# Patient Record
Sex: Male | Born: 1975 | Race: Black or African American | Hispanic: No | Smoking: Current every day smoker
Health system: Southern US, Community
[De-identification: ages and names within clinical notes are randomized; demographics above are authoritative.]

---

## 2004-03-23 ENCOUNTER — Emergency Department (HOSPITAL_COMMUNITY): Admission: EM | Admit: 2004-03-23 | Discharge: 2004-03-24 | Payer: Self-pay | Admitting: Emergency Medicine

## 2004-03-23 ENCOUNTER — Emergency Department (HOSPITAL_COMMUNITY): Admission: EM | Admit: 2004-03-23 | Discharge: 2004-03-23 | Payer: Self-pay | Admitting: Emergency Medicine

## 2005-04-26 ENCOUNTER — Emergency Department (HOSPITAL_COMMUNITY): Admission: EM | Admit: 2005-04-26 | Discharge: 2005-04-27 | Payer: Self-pay | Admitting: Emergency Medicine

## 2012-08-24 ENCOUNTER — Encounter (HOSPITAL_COMMUNITY): Payer: Self-pay | Admitting: Emergency Medicine

## 2012-08-24 ENCOUNTER — Emergency Department (HOSPITAL_COMMUNITY)
Admission: EM | Admit: 2012-08-24 | Discharge: 2012-08-24 | Disposition: A | Discharge status: Home / Self Care | Payer: Self-pay | Attending: Emergency Medicine | Admitting: Emergency Medicine

## 2012-08-24 DIAGNOSIS — F17200 Nicotine dependence, unspecified, uncomplicated: Secondary | ICD-10-CM | POA: Insufficient documentation

## 2012-08-24 DIAGNOSIS — N342 Other urethritis: Secondary | ICD-10-CM | POA: Insufficient documentation

## 2012-08-24 MED ORDER — AZITHROMYCIN 250 MG PO TABS
1000.0000 mg | ORAL_TABLET | Freq: Once | ORAL | Status: AC
  Administered 2012-08-24: 1000 mg via ORAL
  Filled 2012-08-24: qty 4

## 2012-08-24 MED ORDER — CEFTRIAXONE SODIUM 250 MG IJ SOLR
250.0000 mg | Freq: Once | INTRAMUSCULAR | Status: AC
  Administered 2012-08-24: 250 mg via INTRAMUSCULAR
  Filled 2012-08-24: qty 250

## 2012-08-24 NOTE — ED Provider Notes (Signed)
Medical screening examination/treatment/procedure(s) were performed by non-physician practitioner and as supervising physician I was immediately available for consultation/collaboration.  Arbadella Kimbler M Kasi Lasky, MD 08/24/12 0852 

## 2012-08-24 NOTE — ED Notes (Signed)
Patient states he has had white penile discharge x 4 days.  Patient states it burns when he urinates intermittently.

## 2012-08-24 NOTE — ED Provider Notes (Signed)
History     CSN: [id number]  Arrival date & time 08/24/12  0705   First MD Initiated Contact with Patient 08/24/12 [phone]      No chief complaint on file.   (Consider location/radiation/quality/duration/timing/severity/associated sxs/prior treatment) Patient is a 37 y.o. male presenting with penile discharge. The history is provided by the patient. No language interpreter was used.  Penile Discharge This is a new problem. The current episode started today. The problem occurs constantly. Pertinent negatives include no abdominal pain. Nothing aggravates the symptoms. He has tried nothing for the symptoms. The treatment provided moderate relief.  Pt reports burning with urination and penile discharge  No past medical history on file.  No past surgical history on file.  No family history on file.  History  Substance Use Topics  . Smoking status: Not on file  . Smokeless tobacco: Not on file  . Alcohol Use: Not on file      Review of Systems  Gastrointestinal: Negative for abdominal pain.  Genitourinary: Positive for discharge.  All other systems reviewed and are negative.    Allergies  Review of patient's allergies indicates not on file.  Home Medications  No current outpatient prescriptions on file.  BP 140/91  Pulse 89  Temp(Src) 98.9 F (37.2 C) (Oral)  SpO2 99%  Physical Exam  Constitutional: He is oriented to person, place, and time. He appears well-developed.  HENT:  Head: Normocephalic.  Eyes: Pupils are equal, round, and reactive to light.  Neck: Normal range of motion.  Cardiovascular: Normal rate.   Pulmonary/Chest: Effort normal.  Genitourinary: Penis normal.  Clear discharge no lesions  Neurological: He is alert and oriented to person, place, and time. He has normal reflexes.  Skin: Skin is warm.  Psychiatric: He has a normal mood and affect.    ED Course  Procedures (including critical care time)  Labs Reviewed - No data to display No  results found.   No diagnosis found.    MDM  Rocephin and zithromax        Elson Areas, [place] 08/24/12 [phone]

## 2012-08-27 ENCOUNTER — Telehealth (HOSPITAL_COMMUNITY): Payer: Self-pay | Admitting: Emergency Medicine

## 2013-06-04 ENCOUNTER — Emergency Department (HOSPITAL_COMMUNITY)
Admission: EM | Admit: 2013-06-04 | Discharge: 2013-06-04 | Disposition: A | Discharge status: Home / Self Care | Payer: Self-pay | Source: Home / Self Care

## 2013-06-05 ENCOUNTER — Encounter (HOSPITAL_COMMUNITY): Payer: Self-pay | Admitting: Emergency Medicine

## 2013-06-05 ENCOUNTER — Emergency Department (INDEPENDENT_AMBULATORY_CARE_PROVIDER_SITE_OTHER)
Admission: EM | Admit: 2013-06-05 | Discharge: 2013-06-05 | Disposition: A | Discharge status: Home / Self Care | Payer: Self-pay | Source: Home / Self Care | Attending: Family Medicine | Admitting: Family Medicine

## 2013-06-05 DIAGNOSIS — A084 Viral intestinal infection, unspecified: Secondary | ICD-10-CM

## 2013-06-05 DIAGNOSIS — A088 Other specified intestinal infections: Secondary | ICD-10-CM

## 2013-06-05 DIAGNOSIS — K602 Anal fissure, unspecified: Secondary | ICD-10-CM

## 2013-06-05 LAB — OCCULT BLOOD, POC DEVICE: FECAL OCCULT BLD: NEGATIVE

## 2013-06-05 MED ORDER — LOPERAMIDE HCL 2 MG PO CAPS: 2.0000 mg | ORAL_CAPSULE | Freq: Four times a day (QID) | ORAL | Status: DC | PRN

## 2013-06-05 MED ORDER — ONDANSETRON 8 MG PO TBDP: 8.0000 mg | ORAL_TABLET | Freq: Three times a day (TID) | ORAL | Status: DC | PRN

## 2013-06-05 NOTE — Discharge Instructions (Signed)
Anal Fissure, Adult An anal fissure is a small tear or crack in the skin around the anus. Bleeding from a fissure usually stops on its own within a few minutes. However, bleeding will often reoccur with each bowel movement until the crack heals.  CAUSES   Passing large, hard stools.  Frequent diarrheal stools.  Constipation.  Inflammatory bowel disease (Crohn's disease or ulcerative colitis).  Infections.  Anal sex. SYMPTOMS   Small amounts of blood seen on your stools, on toilet paper, or in the toilet after a bowel movement.  Rectal bleeding.  Painful bowel movements.  Itching or irritation around the anus. DIAGNOSIS Your caregiver will examine the anal area. An anal fissure can usually be seen with careful inspection. A rectal exam may be performed and a short tube (anoscope) may be used to examine the anal canal. TREATMENT   You may be instructed to take fiber supplements. These supplements can soften your stool to help make bowel movements easier.  Sitz baths may be recommended to help heal the tear. Do not use soap in the sitz baths.  A medicated cream or ointment may be prescribed to lessen discomfort. HOME CARE INSTRUCTIONS   Maintain a diet high in fruits, whole grains, and vegetables. Avoid constipating foods like bananas and dairy products.  Take sitz baths as directed by your caregiver.  Drink enough fluids to keep your urine clear or pale yellow.  Only take over-the-counter or prescription medicines for pain, discomfort, or fever as directed by your caregiver. Do not take aspirin as this may increase bleeding.  Do not use ointments containing numbing medications (anesthetics) or hydrocortisone. They could slow healing. SEEK MEDICAL CARE IF:   Your fissure is not completely healed within 3 days.  You have further bleeding.  You have a fever.  You have diarrhea mixed with blood.  You have pain.  Your problem is getting worse rather than  better. MAKE SURE YOU:   Understand these instructions.  Will watch your condition.  Will get help right away if you are not doing well or get worse. Document Released: 03/14/2005 Document Revised: 06/06/2011 Document Reviewed: 08/29/2010 [facility] Patient Information 2014 [place], [place].  Diet for Diarrhea, Adult Frequent, runny stools (diarrhea) may be caused or worsened by food or drink. Diarrhea may be relieved by changing your diet. Since diarrhea can last up to 7 days, it is easy for you to lose too much fluid from the body and become dehydrated. Fluids that are lost need to be replaced. Along with a modified diet, make sure you drink enough fluids to keep your urine clear or pale yellow. DIET INSTRUCTIONS  Ensure adequate fluid intake (hydration): have 1 cup (8 oz) of fluid for each diarrhea episode. Avoid fluids that contain simple sugars or sports drinks, fruit juices, whole milk products, and sodas. Your urine should be clear or pale yellow if you are drinking enough fluids. Hydrate with an oral rehydration solution that you can purchase at pharmacies, retail stores, and online. You can prepare an oral rehydration solution at home by mixing the following ingredients together:    tsp table salt.   tsp baking soda.   tsp salt substitute containing potassium chloride.  1  tablespoons sugar.  1 L (34 oz) of water.  Certain foods and beverages may increase the speed at which food moves through the gastrointestinal (GI) tract. These foods and beverages should be avoided and include:  Caffeinated and alcoholic beverages.  High-fiber foods, such as raw fruits  and vegetables, nuts, seeds, and whole grain breads and cereals.  Foods and beverages sweetened with sugar alcohols, such as xylitol, sorbitol, and mannitol.  Some foods may be well tolerated and may help thicken stool including:  Starchy foods, such as rice, toast, pasta, low-sugar cereal, oatmeal, grits, baked potatoes,  crackers, and bagels.   Bananas.   Applesauce.  Add probiotic-rich foods to help increase healthy bacteria in the GI tract, such as yogurt and fermented milk products. RECOMMENDED FOODS AND BEVERAGES Starches Choose foods with less than 2 g of fiber per serving.  Recommended:  White, [place], and pita breads, plain rolls, buns, bagels. Plain muffins, matzo. Soda, saltine, or graham crackers. Pretzels, melba toast, zwieback. Cooked cereals made with water: cornmeal, farina, cream cereals. Dry cereals: refined corn, wheat, rice. Potatoes prepared any way without skins, refined macaroni, spaghetti, noodles, refined rice.  Avoid:  Bread, rolls, or crackers made with whole wheat, multi-grains, rye, bran seeds, nuts, or coconut. Corn tortillas or taco shells. Cereals containing whole grains, multi-grains, bran, coconut, nuts, raisins. Cooked or dry oatmeal. Coarse wheat cereals, granola. Cereals advertised as "high-fiber." Potato skins. Whole grain pasta, wild or brown rice. Popcorn. Sweet potatoes, yams. Sweet rolls, doughnuts, waffles, pancakes, sweet breads. Vegetables  Recommended: Strained tomato and vegetable juices. Most well-cooked and canned vegetables without seeds. Fresh: Tender lettuce, cucumber without the skin, cabbage, spinach, bean sprouts.  Avoid: Fresh, cooked, or canned: Artichokes, baked beans, beet greens, broccoli, Brussels sprouts, corn, kale, legumes, peas, sweet potatoes. Cooked: Green or red cabbage, spinach. Avoid large servings of any vegetables because vegetables shrink when cooked, and they contain more fiber per serving than fresh vegetables. Fruit  Recommended: Cooked or canned: Apricots, applesauce, cantaloupe, cherries, fruit cocktail, grapefruit, grapes, kiwi, mandarin oranges, peaches, pears, plums, watermelon. Fresh: Apples without skin, ripe banana, grapes, cantaloupe, cherries, grapefruit, peaches, oranges, plums. Keep servings limited to  cup or 1  piece.  Avoid: Fresh: Apples with skin, apricots, mangoes, pears, raspberries, strawberries. Prune juice, stewed or dried prunes. Dried fruits, raisins, dates. Large servings of all fresh fruits. Protein  Recommended: Ground or well-cooked tender beef, ham, veal, lamb, pork, or poultry. Eggs. Fish, oysters, shrimp, lobster, other seafoods. Liver, organ meats.  Avoid: Tough, fibrous meats with gristle. Peanut butter, smooth or chunky. Cheese, nuts, seeds, legumes, dried peas, beans, lentils. Dairy  Recommended: Yogurt, lactose-free milk, kefir, drinkable yogurt, buttermilk, soy milk, or plain hard cheese.  Avoid: Milk, chocolate milk, beverages made with milk, such as milkshakes. Soups  Recommended: Bouillon, broth, or soups made from allowed foods. Any strained soup.  Avoid: Soups made from vegetables that are not allowed, cream or milk-based soups. Desserts and Sweets  Recommended: Sugar-free gelatin, sugar-free frozen ice pops made without sugar alcohol.  Avoid: Plain cakes and cookies, pie made with fruit, pudding, custard, cream pie. Gelatin, fruit, ice, sherbet, frozen ice pops. Ice cream, ice milk without nuts. Plain hard candy, honey, jelly, molasses, syrup, sugar, chocolate syrup, gumdrops, marshmallows. Fats and Oils  Recommended: Limit fats to less than 8 tsp per day.  Avoid: Seeds, nuts, olives, avocados. Margarine, butter, cream, mayonnaise, salad oils, plain salad dressings. Plain gravy, crisp bacon without rind. Beverages  Recommended: Water, decaffeinated teas, oral rehydration solutions, sugar-free beverages not sweetened with sugar alcohols.  Avoid: Fruit juices, caffeinated beverages (coffee, tea, soda), alcohol, sports drinks, or lemon-lime soda. Condiments  Recommended: Ketchup, mustard, horseradish, vinegar, cocoa powder. Spices in moderation: allspice, basil, bay leaves, celery powder or leaves, cinnamon, cumin powder, curry  powder, ginger, mace, marjoram,  onion or garlic powder, oregano, paprika, parsley flakes, ground pepper, rosemary, sage, savory, tarragon, thyme, turmeric.  Avoid: Coconut, honey. Document Released: 06/04/2003 Document Revised: 12/07/2011 Document Reviewed: 07/29/2011 [facility] Patient Information 2014 [place], [place].  Viral Gastroenteritis Viral gastroenteritis is also known as stomach flu. This condition affects the stomach and intestinal tract. It can cause sudden diarrhea and vomiting. The illness typically lasts 3 to 8 days. Most people develop an immune response that eventually gets rid of the virus. While this natural response develops, the virus can make you quite ill. CAUSES  Many different viruses can cause gastroenteritis, such as rotavirus or noroviruses. You can catch one of these viruses by consuming contaminated food or water. You may also catch a virus by sharing utensils or other personal items with an infected person or by touching a contaminated surface. SYMPTOMS  The most common symptoms are diarrhea and vomiting. These problems can cause a severe loss of body fluids (dehydration) and a body salt (electrolyte) imbalance. Other symptoms may include:  Fever.  Headache.  Fatigue.  Abdominal pain. DIAGNOSIS  Your caregiver can usually diagnose viral gastroenteritis based on your symptoms and a physical exam. A stool sample may also be taken to test for the presence of viruses or other infections. TREATMENT  This illness typically goes away on its own. Treatments are aimed at rehydration. The most serious cases of viral gastroenteritis involve vomiting so severely that you are not able to keep fluids down. In these cases, fluids must be given through an intravenous line (IV). HOME CARE INSTRUCTIONS   Drink enough fluids to keep your urine clear or pale yellow. Drink small amounts of fluids frequently and increase the amounts as tolerated.  Ask your caregiver for specific rehydration  instructions.  Avoid:  Foods high in sugar.  Alcohol.  Carbonated drinks.  Tobacco.  Juice.  Caffeine drinks.  Extremely hot or cold fluids.  Fatty, greasy foods.  Too much intake of anything at one time.  Dairy products until 24 to 48 hours after diarrhea stops.  You may consume probiotics. Probiotics are active cultures of beneficial bacteria. They may lessen the amount and number of diarrheal stools in adults. Probiotics can be found in yogurt with active cultures and in supplements.  Wash your hands well to avoid spreading the virus.  Only take over-the-counter or prescription medicines for pain, discomfort, or fever as directed by your caregiver. Do not give aspirin to children. Antidiarrheal medicines are not recommended.  Ask your caregiver if you should continue to take your regular prescribed and over-the-counter medicines.  Keep all follow-up appointments as directed by your caregiver. SEEK IMMEDIATE MEDICAL CARE IF:   You are unable to keep fluids down.  You do not urinate at least once every 6 to 8 hours.  You develop shortness of breath.  You notice blood in your stool or vomit. This may look like coffee grounds.  You have abdominal pain that increases or is concentrated in one small area (localized).  You have persistent vomiting or diarrhea.  You have a fever.  The patient is a child younger than 3 months, and he or she has a fever.  The patient is a child older than 3 months, and he or she has a fever and persistent symptoms.  The patient is a child older than 3 months, and he or she has a fever and symptoms suddenly get worse.  The patient is a baby, and he or she has  no tears when crying. MAKE SURE YOU:   Understand these instructions.  Will watch your condition.  Will get help right away if you are not doing well or get worse. Document Released: 03/14/2005 Document Revised: 06/06/2011 Document Reviewed: 12/29/2010 [facility] Patient  Information 2014 [place], [place].

## 2013-06-05 NOTE — ED Provider Notes (Signed)
CSN: [id number]     Arrival date & time 06/05/13  0907 History   First MD Initiated Contact with Patient 06/05/13 [phone]     Chief Complaint  Patient presents with  . Emesis  . Diarrhea  . Hemorrhoids   (Consider location/radiation/quality/duration/timing/severity/associated sxs/prior Treatment) HPI Comments: [age]m with vomiting, diarrhea, and possible flare up of hemorrhoids.  He has vomiting and diarrhea since Monday, however both of these are starting to slow down. He is holding down oral fluids and bland foods. He continues to have diarrhea though. Has bleeding with wiping and rectal pain.  Getting better with preparation H.  Last vomiting yesterday, last diarrhea this AM.  He has not had any fever. He has a history of hemorrhoids and he thinks that these have flared up. He states he has felt a small lump when he wipes after an episode of diarrhea. No history of inflammatory bowel disease. No weight loss or night sweats. No blood in the diarrhea or vomitus. No abdominal pain  Patient is a 38 y.o. male presenting with vomiting and diarrhea.  Emesis Associated symptoms: diarrhea   Associated symptoms: no abdominal pain, no arthralgias, no chills, no myalgias and no sore throat   Diarrhea Associated symptoms: vomiting   Associated symptoms: no abdominal pain, no arthralgias, no chills, no diaphoresis, no fever and no myalgias     History reviewed. No pertinent past medical history. History reviewed. No pertinent past surgical history. History reviewed. No pertinent family history. History  Substance Use Topics  . Smoking status: Current Every Day Smoker -- 0.50 packs/day    Types: Cigarettes  . Smokeless tobacco: Not on file  . Alcohol Use: 0.6 oz/week    1 Cans of beer per week     Comment: beer/day    Review of Systems  Constitutional: Negative for fever, chills, diaphoresis, fatigue and unexpected weight change.  HENT: Negative for sore throat.   Eyes: Negative for visual  disturbance.  Respiratory: Negative for cough and shortness of breath.   Cardiovascular: Negative for chest pain, palpitations and leg swelling.  Gastrointestinal: Positive for nausea, vomiting, diarrhea, anal bleeding and rectal pain. Negative for abdominal pain, constipation and blood in stool.  Genitourinary: Negative for dysuria, urgency, frequency and hematuria.  Musculoskeletal: Negative for arthralgias, myalgias, neck pain and neck stiffness.  Skin: Negative for rash.  Neurological: Negative for dizziness, weakness and light-headedness.    Allergies  Review of patient's allergies indicates no known allergies.  Home Medications   Current Outpatient Rx  Name  Route  Sig  Dispense  Refill  . loperamide (IMODIUM) 2 MG capsule   Oral   Take 1 capsule (2 mg total) by mouth 4 (four) times daily as needed for diarrhea or loose stools.   12 capsule   0   . ondansetron (ZOFRAN ODT) 8 MG disintegrating tablet   Oral   Take 1 tablet (8 mg total) by mouth every 8 (eight) hours as needed for nausea or vomiting.   12 tablet   0    BP 127/83  Pulse 78  Temp(Src) 97.9 F (36.6 C) (Oral)  Resp 16  SpO2 100% Physical Exam  Nursing note and vitals reviewed. Constitutional: He is oriented to person, place, and time. He appears well-developed and well-nourished. No distress.  HENT:  Head: Normocephalic.  Cardiovascular: Normal rate, regular rhythm and normal heart sounds.  Exam reveals no gallop and no friction rub.   No murmur heard. Pulmonary/Chest: Effort normal. No respiratory distress.  Abdominal: Normal appearance and bowel sounds are normal. There is tenderness (mild to moderate, 5/10 ) in the right upper quadrant and periumbilical area. There is no rigidity, no rebound, no guarding, no CVA tenderness, no tenderness at McBurney's point and negative Murphy's sign. No hernia.  Genitourinary: Prostate normal. Rectal exam shows fissure (at 12:00 position ) and tenderness  (perirectal). Rectal exam shows no external hemorrhoid, no internal hemorrhoid, no mass and anal tone normal. Guaiac negative stool.  Neurological: He is alert and oriented to person, place, and time. Coordination normal.  Skin: Skin is warm and dry. No rash noted. He is not diaphoretic.  Psychiatric: He has a normal mood and affect. Judgment normal.    ED Course  Procedures (including critical care time) Labs Review Labs Reviewed  OCCULT BLOOD, POC DEVICE   Imaging Review No results found.   MDM   1. Viral gastroenteritis   2. Rectal fissure    He has rectal pain and tenderness consistent with a small rectal fissure. There are no rectal masses. There is no obvious internal or external hemorrhoid. Because this is getting better with Preparation H, he will continue to use that. Treat the nausea with Zofran and treating the diarrhea with Imodium. He will followup with gastroenterology.  Meds ordered this encounter  Medications  . ondansetron (ZOFRAN ODT) 8 MG disintegrating tablet    Sig: Take 1 tablet (8 mg total) by mouth every 8 (eight) hours as needed for nausea or vomiting.    Dispense:  12 tablet    Refill:  0    Order Specific Question:  Supervising Provider    Answer:  Clementeen Graham, S [id number]  . loperamide (IMODIUM) 2 MG capsule    Sig: Take 1 capsule (2 mg total) by mouth 4 (four) times daily as needed for diarrhea or loose stools.    Dispense:  12 capsule    Refill:  0    Order Specific Question:  Supervising Provider    Answer:  Clementeen Graham, Kathie Rhodes [[id number]]     Graylon Good, PA-C 06/05/13 2219

## 2013-06-05 NOTE — ED Notes (Signed)
C/o vomiting and diarrhea since Monday.  States "flare up of hemorriods and having bleeding".  Denies blood in stool.  Abdominal cramping.  No otc meds taken for symptoms.  Denies fever.

## 2013-06-06 NOTE — ED Provider Notes (Signed)
Medical screening examination/treatment/procedure(s) were performed by a resident physician or non-physician practitioner and as the supervising physician I was immediately available for consultation/collaboration.  Katya Rolston, MD    Briannie Gutierrez S Gita Dilger, MD 06/06/13 0810 

## 2013-12-05 ENCOUNTER — Encounter (HOSPITAL_COMMUNITY): Payer: Self-pay | Admitting: Emergency Medicine

## 2013-12-05 ENCOUNTER — Emergency Department (HOSPITAL_COMMUNITY)
Admission: EM | Admit: 2013-12-05 | Discharge: 2013-12-05 | Disposition: A | Discharge status: Home / Self Care | Payer: Self-pay | Attending: Emergency Medicine | Admitting: Emergency Medicine

## 2013-12-05 DIAGNOSIS — E860 Dehydration: Secondary | ICD-10-CM | POA: Insufficient documentation

## 2013-12-05 DIAGNOSIS — F17200 Nicotine dependence, unspecified, uncomplicated: Secondary | ICD-10-CM | POA: Insufficient documentation

## 2013-12-05 DIAGNOSIS — E162 Hypoglycemia, unspecified: Secondary | ICD-10-CM | POA: Insufficient documentation

## 2013-12-05 LAB — CBC WITH DIFFERENTIAL/PLATELET
BASOS ABS: 0 10*3/uL (ref 0.0–0.1)
BASOS PCT: 0 % (ref 0–1)
EOS PCT: 1 % (ref 0–5)
Eosinophils Absolute: 0.1 10*3/uL (ref 0.0–0.7)
HEMATOCRIT: 52.5 % — AB (ref 39.0–52.0)
HEMOGLOBIN: 18.2 g/dL — AB (ref 13.0–17.0)
LYMPHS ABS: 2.2 10*3/uL (ref 0.7–4.0)
LYMPHS PCT: 21 % (ref 12–46)
MCH: 33.2 pg (ref 26.0–34.0)
MCHC: 34.7 g/dL (ref 30.0–36.0)
MCV: 95.8 fL (ref 78.0–100.0)
MONOS PCT: 7 % (ref 3–12)
Monocytes Absolute: 0.7 10*3/uL (ref 0.1–1.0)
NEUTROS ABS: 7.6 10*3/uL (ref 1.7–7.7)
NEUTROS PCT: 71 % (ref 43–77)
Platelets: 232 10*3/uL (ref 150–400)
RBC: 5.48 MIL/uL (ref 4.22–5.81)
RDW: 12.5 % (ref 11.5–15.5)
WBC: 10.7 10*3/uL — ABNORMAL HIGH (ref 4.0–10.5)

## 2013-12-05 LAB — I-STAT TROPONIN, ED
Troponin i, poc: 0 ng/mL (ref 0.00–0.08)
Troponin i, poc: 0 ng/mL (ref 0.00–0.08)

## 2013-12-05 LAB — URINALYSIS, ROUTINE W REFLEX MICROSCOPIC
BILIRUBIN URINE: NEGATIVE
GLUCOSE, UA: NEGATIVE mg/dL
Ketones, ur: NEGATIVE mg/dL
Leukocytes, UA: NEGATIVE
NITRITE: NEGATIVE
PROTEIN: NEGATIVE mg/dL
Specific Gravity, Urine: 1.008 (ref 1.005–1.030)
UROBILINOGEN UA: 0.2 mg/dL (ref 0.0–1.0)
pH: 6.5 (ref 5.0–8.0)

## 2013-12-05 LAB — BASIC METABOLIC PANEL
Anion gap: 9 (ref 5–15)
BUN: 9 mg/dL (ref 6–23)
CALCIUM: 8.7 mg/dL (ref 8.4–10.5)
CHLORIDE: 106 meq/L (ref 96–112)
CO2: 25 mEq/L (ref 19–32)
CREATININE: 1.06 mg/dL (ref 0.50–1.35)
GFR calc non Af Amer: 88 mL/min — ABNORMAL LOW (ref >90)
Glucose, Bld: 95 mg/dL (ref 70–99)
Potassium: 4.8 mEq/L (ref 3.7–5.3)
SODIUM: 140 meq/L (ref 137–147)

## 2013-12-05 LAB — CBG MONITORING, ED
Glucose-Capillary: 165 mg/dL — ABNORMAL HIGH (ref 70–99)
Glucose-Capillary: 89 mg/dL (ref 70–99)

## 2013-12-05 LAB — URINE MICROSCOPIC-ADD ON

## 2013-12-05 MED ORDER — SODIUM CHLORIDE 0.9 % IV BOLUS (SEPSIS)
1000.0000 mL | Freq: Once | INTRAVENOUS | Status: AC
  Administered 2013-12-05: 1000 mL via INTRAVENOUS

## 2013-12-05 MED ORDER — ONDANSETRON HCL 4 MG/2ML IJ SOLN
4.0000 mg | Freq: Once | INTRAMUSCULAR | Status: AC
  Administered 2013-12-05: 4 mg via INTRAVENOUS
  Filled 2013-12-05: qty 2

## 2013-12-05 NOTE — ED Provider Notes (Signed)
CSN: [id number]     Arrival date & time 12/05/13  1011 History   First MD Initiated Contact with Patient 12/05/13 1038     Chief Complaint  Patient presents with  . Hypoglycemia     (Consider location/radiation/quality/duration/timing/severity/associated sxs/prior Treatment) HPI  Troy Barnett is a 38 y.o. male brought in by EMS for presyncopal sensation. Patient states that he walked 10 miles to this facility to give plasma, states it was a blood return plasmapheresis but the needle was very painful. If he normally does but for only 45 minutes but today it was an hour and a half. States that he had chest pain, nausea and vomiting. States chest pain is now completely resolved. He has family history of cardiac issues, he is at daily smoker. No past medical history but he does not have a primary care provider. States that he feels much better after he was given glucose paste and fluids by EMS. As per EMS CBG was 65  History reviewed. No pertinent past medical history. History reviewed. No pertinent past surgical history. History reviewed. No pertinent family history. History  Substance Use Topics  . Smoking status: Current Every Day Smoker -- 0.50 packs/day    Types: Cigarettes  . Smokeless tobacco: Not on file  . Alcohol Use: 0.6 oz/week    1 Cans of beer per week     Comment: beer/day    Review of Systems  10 systems reviewed and found to be negative, except as noted in the HPI.   Allergies  Review of patient's allergies indicates no known allergies.  Home Medications   Prior to Admission medications   Not on File   BP 124/82  Pulse 71  Temp(Src) 98.6 F (37 C) (Oral)  Resp 24  SpO2 100% Physical Exam  Nursing note and vitals reviewed. Constitutional: He is oriented to person, place, and time. He appears well-developed and well-nourished. No distress.  HENT:  Head: Normocephalic and atraumatic.  Mouth/Throat: Oropharynx is clear and moist.  Eyes: Conjunctivae and  EOM are normal. Pupils are equal, round, and reactive to light.  Cardiovascular: Normal rate, regular rhythm and intact distal pulses.   Pulmonary/Chest: Effort normal and breath sounds normal. No stridor. No respiratory distress. He has no wheezes. He has no rales. He exhibits no tenderness.  Abdominal: Soft. Bowel sounds are normal. He exhibits no distension and no mass. There is no tenderness. There is no rebound and no guarding.  Musculoskeletal: Normal range of motion. He exhibits no edema and no tenderness.  Neurological: He is alert and oriented to person, place, and time.  Skin: Skin is warm.  Psychiatric: He has a normal mood and affect.    ED Course  Procedures (including critical care time) Labs Review Labs Reviewed  CBC WITH DIFFERENTIAL - Abnormal; Notable for the following:    WBC 10.7 (*)    Hemoglobin 18.2 (*)    HCT 52.5 (*)    All other components within normal limits  BASIC METABOLIC PANEL - Abnormal; Notable for the following:    GFR calc non Af Amer 88 (*)    All other components within normal limits  URINALYSIS, ROUTINE W REFLEX MICROSCOPIC - Abnormal; Notable for the following:    Hgb urine dipstick TRACE (*)    All other components within normal limits  CBG MONITORING, ED - Abnormal; Notable for the following:    Glucose-Capillary 165 (*)    All other components within normal limits  URINE MICROSCOPIC-ADD ON  CBG MONITORING, ED  I-STAT TROPOININ, ED  I-STAT TROPOININ, ED  I-STAT TROPOININ, ED    Imaging Review No results found.   EKG Interpretation   Date/Time:  Thursday December 05 2013 10:20:46 EDT Ventricular Rate:  72 PR Interval:  169 QRS Duration: 82 QT Interval:  389 QTC Calculation: 426 R Axis:   81 Text Interpretation:  Sinus rhythm Probable left atrial enlargement ST  elev, probable normal early repol pattern No significant change was found  Confirmed by [facility]  MD, TREY ([id number]) on 12/05/2013 10:26:31 AM      MDM   Final  diagnoses:  Dehydration  Tobacco use disorder    Filed Vitals:   12/05/13 1400 12/05/13 1500 12/05/13 1514 12/05/13 1530  BP: 120/81 114/58  124/82  Pulse:    71  Temp:   98.6 F (37 C)   TempSrc:      Resp:  21  24  SpO2:    100%    Medications  sodium chloride 0.9 % bolus 1,000 mL (0 mLs Intravenous Stopped 12/05/13 1232)  ondansetron (ZOFRAN) injection 4 mg (4 mg Intravenous Given 12/05/13 1136)  sodium chloride 0.9 % bolus 1,000 mL (0 mLs Intravenous Stopped 12/05/13 1429)  sodium chloride 0.9 % bolus 1,000 mL (0 mLs Intravenous Stopped 12/05/13 1605)    Troy Barnett is a 38 y.o. male presenting with presyncopal sensation, chest pain, nausea or vomiting while he was donating plasma earlier in the day. Patient was found to have a blood glucose of 65 and felt better after IV fluids and oral glucose. Patient is found to be orthostatic, fluid bolus initiated, patient has risk factor of active daily smoker and considering the chest pain I will obtain EKG and troponin. EKG with early re pole pattern.   Blood work shows  hemoconcentration and 18/52. Patient will be bolused the second time. We'll also obtain Delta Trop. Patient has been given several liters of fluid and the second troponin is negative. He is feeling significantly improved and is amenable to discharge.  Evaluation does not show pathology that would require ongoing emergent intervention or inpatient treatment. Pt is hemodynamically stable and mentating appropriately. Discussed findings and plan with patient/guardian, who agrees with care plan. All questions answered. Return precautions discussed and outpatient follow up given.      Wynetta Emery, PA-C 12/05/13 1707

## 2013-12-05 NOTE — Discharge Instructions (Signed)
Push fluids: take small frequent sips of water or Gatorade, do not drink any soda, juice or caffeinated beverages.    Do not hesitate to return to the emergency room for any new, worsening or concerning symptoms.  Please obtain primary care using resource guide below. But the minute you were seen in the emergency room and that they will need to obtain records for further outpatient management.   Dehydration, Adult Dehydration is when you lose more fluids from the body than you take in. Vital organs like the kidneys, brain, and heart cannot function without a proper amount of fluids and salt. Any loss of fluids from the body can cause dehydration.  CAUSES   Vomiting.  Diarrhea.  Excessive sweating.  Excessive urine output.  Fever. SYMPTOMS  Mild dehydration  Thirst.  Dry lips.  Slightly dry mouth. Moderate dehydration  Very dry mouth.  Sunken eyes.  Skin does not bounce back quickly when lightly pinched and released.  Dark urine and decreased urine production.  Decreased tear production.  Headache. Severe dehydration  Very dry mouth.  Extreme thirst.  Rapid, weak pulse (more than 100 beats per minute at rest).  Cold hands and feet.  Not able to sweat in spite of heat and temperature.  Rapid breathing.  Blue lips.  Confusion and lethargy.  Difficulty being awakened.  Minimal urine production.  No tears. DIAGNOSIS  Your caregiver will diagnose dehydration based on your symptoms and your exam. Blood and urine tests will help confirm the diagnosis. The diagnostic evaluation should also identify the cause of dehydration. TREATMENT  Treatment of mild or moderate dehydration can often be done at home by increasing the amount of fluids that you drink. It is best to drink small amounts of fluid more often. Drinking too much at one time can make vomiting worse. Refer to the home care instructions below. Severe dehydration needs to be treated at the hospital  where you will probably be given intravenous (IV) fluids that contain water and electrolytes. HOME CARE INSTRUCTIONS   Ask your caregiver about specific rehydration instructions.  Drink enough fluids to keep your urine clear or pale yellow.  Drink small amounts frequently if you have nausea and vomiting.  Eat as you normally do.  Avoid:  Foods or drinks high in sugar.  Carbonated drinks.  Juice.  Extremely hot or cold fluids.  Drinks with caffeine.  Fatty, greasy foods.  Alcohol.  Tobacco.  Overeating.  Gelatin desserts.  Wash your hands well to avoid spreading bacteria and viruses.  Only take over-the-counter or prescription medicines for pain, discomfort, or fever as directed by your caregiver.  Ask your caregiver if you should continue all prescribed and over-the-counter medicines.  Keep all follow-up appointments with your caregiver. SEEK MEDICAL CARE IF:  You have abdominal pain and it increases or stays in one area (localizes).  You have a rash, stiff neck, or severe headache.  You are irritable, sleepy, or difficult to awaken.  You are weak, dizzy, or extremely thirsty. SEEK IMMEDIATE MEDICAL CARE IF:   You are unable to keep fluids down or you get worse despite treatment.  You have frequent episodes of vomiting or diarrhea.  You have blood or green matter (bile) in your vomit.  You have blood in your stool or your stool looks black and tarry.  You have not urinated in 6 to 8 hours, or you have only urinated a small amount of very dark urine.  You have a fever.  You faint.  MAKE SURE YOU:   Understand these instructions.  Will watch your condition.  Will get help right away if you are not doing well or get worse. Document Released: 03/14/2005 Document Revised: 06/06/2011 Document Reviewed: 11/01/2010 [facility] Patient Information 2015 [place], [place]. This information is not intended to replace advice given to you by your health care  provider. Make sure you discuss any questions you have with your health care provider.   Emergency Department Resource Guide 1) Find a Doctor and Pay Out of Pocket Although you won't have to find out who is covered by your insurance plan, it is a good idea to ask around and get recommendations. You will then need to call the office and see if the doctor you have chosen will accept you as a new patient and what types of options they offer for patients who are self-pay. Some doctors offer discounts or will set up payment plans for their patients who do not have insurance, but you will need to ask so you aren't surprised when you get to your appointment.  2) Contact Your Local Health Department Not all health departments have doctors that can see patients for sick visits, but many do, so it is worth a call to see if yours does. If you don't know where your local health department is, you can check in your phone book. The CDC also has a tool to help you locate your state's health department, and many state websites also have listings of all of their local health departments.  3) Find a Walk-in Clinic If your illness is not likely to be very severe or complicated, you may want to try a walk in clinic. These are popping up all over the country in pharmacies, drugstores, and shopping centers. They're usually staffed by nurse practitioners or physician assistants that have been trained to treat common illnesses and complaints. They're usually fairly quick and inexpensive. However, if you have serious medical issues or chronic medical problems, these are probably not your best option.  No Primary Care Doctor: - Call Health Connect at  [phone] - they can help you locate a primary care doctor that  accepts your insurance, provides certain services, etc. - Physician Referral Service- [phone]  Chronic Pain Problems: Organization         Address  Phone   Notes  [facility]  [phone] Patients need to be referred by their primary care doctor.   Medication Assistance: Organization         Address  Phone   Notes  [facility] Medication [facility] [address] [place], [place] [address] [phone] --Must be a resident of [facility] -- Must have NO insurance coverage whatsoever (no Medicaid/ Medicare, etc.) -- The pt. MUST have a primary care doctor that directs their care regularly and follows them in the community   MedAssist  [phone]   [employer]  [phone]    Agencies that provide inexpensive medical care: Organization         Address  Phone   Notes  [facility]  [phone]   [facility]    [phone]   [facility] [address] [place], [place] [address] [phone]   [facility] [address] [place], [place] [phone]   Planned Parenthood    [phone]   [facility]    [phone]   [facility]  [address], [place] Phone:  [phone], Fax:  [phone] Hours of Operation:  9 am - 6 pm, M-F.  Also accepts Medicaid/Medicare and self-pay.  [facility]  [address], [place] Phone: [phone], Fax: [phone]. Hours of Operation:  8:30 am - 5:30 pm, M-F.  Also accepts Medicaid and self-pay.  [facility] [place] [address], [place] Phone: [phone]   [facility] [address] Natasha Bence [place], [place] [phone], Ext. [phone] Mondays & Thursdays: 7-9 AM.  First 15 patients are seen on a first come, first serve basis.    Medicaid-accepting [facility] Providers:  Organization         Address  Phone   Notes  [facility] [address], [place] [phone] Also accepts self-pay patients.  [facility] [address] Laurell Josephs [place], [place]   [phone]   [facility] [address], [place] [phone]   [facility] [address] [place] [phone]   Renaye Rakers [address], [place]   [phone] Only accepts [place] Access [place] patients after they have their name applied to their card.   Self-Pay (no insurance) in [facility]:  Organization         Address  Phone   Notes  Sickle Cell Patients, [facility] [address] [place], [place] [phone]   [facility] Care [address] [place], [place] [phone]   [facility] [place]  [address], [place] [phone]   [facility]/Dr. Osei-Bonsu  [address] [place] or [address], [place] [phone] Phone number for both [place] and [place] locations is the same.  [facility] [address] [place] [phone]   [facility] [address] [place] or [address] [phone]   [facility] [address] [place] [phone], phone; [phone], fax Sees patients 1st and 3rd Saturday of every month.  Must not qualify for public or private insurance (i.e. Medicaid, Medicare, [place] Health Choice, Veterans' Benefits)  Household income should be no more than 200% of the poverty level The clinic cannot treat you if you are pregnant or think you are pregnant  Sexually transmitted diseases are not treated at the clinic.    Dental Care: Organization         Address  Phone  Notes  [facility] Department of [facility] [address] [place], [place] [phone] Accepts children up to age 101 who are enrolled in [place] or [place] Health Choice; pregnant women with a Medicaid card; and children who have applied for Medicaid or [place] Health Choice, but  were declined, whose parents can pay a reduced fee at time of service.  [facility] of [facility]  [address], [place] [phone] Accepts children up to age 69 who are enrolled in [place] or [place] Health Choice; pregnant women with a Medicaid card; and children who have applied for Medicaid or  Health Choice, but were declined, whose parents can pay a reduced fee at time of service.  [facility]  [phone]  [place], [place] [phone] Patients are seen by appointment only. Walk-ins are not accepted. [place] will see patients 27 years of age and older. Monday - Tuesday (8am-5pm) Most Wednesdays (8:30-5pm) $30 per visit, cash only  [facility] Adult Dental Access PROGRAM  [address], [facility] [phone] Patients are seen by appointment only. Walk-ins are not accepted. [place] will see patients 42 years of age and older. One Wednesday Evening (Monthly: Volunteer Based).  $30 per visit, cash only  [place]  [phone] for adults; Children under age 25, call Graduate Pediatric Dentistry at [phone]. Children aged 4-14, please call [phone] to request a pediatric application.  Dental services are provided in all areas of dental care including fillings, crowns and bridges, complete and partial dentures, implants, gum treatment, root canals, and extractions. Preventive care is also provided. Treatment is provided to both adults and children. Patients are selected via a lottery and there is often a waiting list.   [facility] [address], [place]  [phone] [email or web address]   [facility] [address] [place], [place] [phone], Ext. [phone] Second and Fourth Thursday of each month, opens at 6:30 AM; Clinic ends at 9 AM.  Patients are seen on a first-come first-served basis, and a limited number are seen during each clinic.    [facility]  [address] Hillard Danker [place], [place] [phone]   Eligibility Requirements You must have lived in [place], [place], or [place] counties for at least the last three months.   You cannot be eligible for state or federal sponsored [employer], including [employer], [place], or [employer].   You generally cannot be eligible for healthcare insurance through your employer.    How to apply: Eligibility screenings are held every Tuesday and Wednesday afternoon from 1:00 pm until 4:00 pm. You do not need an appointment for the interview!  [facility] [address] [place], [place]   [place]  [place] Department  [place]  [phone]    Behavioral Health Resources in the Community: Intensive Outpatient Programs Organization         Address  Phone  Notes  [place]. [address] [place], [place] [phone]   [facility] [address] [place], [place]   [facility]: [facility] [address] [place], [place]   [place] [address]  [place], [place] or [phone]   Substance Abuse Resources Organization         Address  Phone  Notes  Alcohol [facility] Drug Services  [phone]   [place]  [phone]   [facility] [place]   Chinita Pester  [phone]   Residential & Outpatient Substance Abuse Program  [phone]   Psychological Services Organization         Address  Phone  Notes  [facility] [place]  [place]  [phone]   [place] [address] [place] or [phone]    Mobile Crisis Teams Organization         Address  Phone  Notes  [facility], Mobile Crisis Care  Unit  [phone]   [facility]  [address] [place], [place]   Ivin Booty  [place] [address] [place] [phone]    Self-Help/Support Groups Organization         Address  Phone             Notes  [facility] [place] - variety of support groups  336- I7437963 Call for more information  Narcotics Anonymous (NA), [facility] [address], [employer] [place]  2 meetings at this location   Statistician         Address  Phone  Notes  [facility] [address],    [place]  [phone]   [facility] [place]  [address], [place] [address], [place], [place] [phone]   [facility] Treatment [facility] [address] [place], [place] [phone] Admissions: 8am-3pm M-F  [facility] [address]    [place], [place] [phone]   [facility] [address] [place], [place], [place] [phone]   [facility] [address],  [place], [place] [phone]   [facility] - Intensive Outpatient [address] Laurell Josephs [address], [place], [place] [phone]   [facility] ([facility] [address] [place].,  [place], [place] [phone] or [phone]   [facility] (RTS) [address] [place], [place] [phone] Accepts Medicaid  [facility] [place] [address],  [place] [phone] Substance Abuse/Addiction Treatment   [facility] Organization         Address  Phone  Notes  [facility]  [phone]   Angie Fava, PhD [address] Ervin Knack [place], [place]   [phone] or [phone]   [facility]   [address] [place], [place] [phone]   [facility] [address] [place], [place] [phone] Insurance/Medicaid/sponsorship through [facility] and Families [address]                                     [place], [place] [phone] Therapy/tele-psych/case  [facility] [address] [place], [place] [phone]    Dr. Lolly Mustache  [phone]   [facility]  [employer] [facility] Dept. 1) [address], [place] 2) [address] [place] 3)  [address], [place] [phone]  [phone]   [facility] Child Abuse Hotline 606-219-6631 or [phone] (After Hours)

## 2013-12-05 NOTE — ED Notes (Signed)
Discharged instructions given to pt by [place] RN.

## 2013-12-05 NOTE — ED Notes (Signed)
Pt. Was giving plasma and usually is on the treatment for 45 minutes and today he was on for over 11/2 hours.  During the treatment he began to report that the iv was pinching and he needed to go to the bathroom,  Became anxious and developed chest pain due to not making it to the bathroom in time.  Pt. Also walked over 10 miles to get to the facility to give plasma.  Paramedics  Arrived and cbg was 65 and was given 24 grams of  oral glucose.   Pt. Is alert and oriented NSR on the monitor.

## 2013-12-09 NOTE — ED Provider Notes (Signed)
Medical screening examination/treatment/procedure(s) were performed by non-physician practitioner and as supervising physician I was immediately available for consultation/collaboration.   EKG Interpretation   Date/Time:  Thursday December 05 2013 10:20:46 EDT Ventricular Rate:  72 PR Interval:  169 QRS Duration: 82 QT Interval:  389 QTC Calculation: 426 R Axis:   81 Text Interpretation:  Sinus rhythm Probable left atrial enlargement ST  elev, probable normal early repol pattern No significant change was found  Confirmed by [facility]  MD, TREY ([id number]) on 12/05/2013 10:26:31 AM        Candyce Churn III, MD 12/09/13 [phone]

## 2014-09-23 ENCOUNTER — Inpatient Hospital Stay (HOSPITAL_COMMUNITY): Payer: Self-pay

## 2014-09-23 ENCOUNTER — Encounter (HOSPITAL_COMMUNITY): Payer: Self-pay

## 2014-09-23 ENCOUNTER — Inpatient Hospital Stay (HOSPITAL_COMMUNITY)
Admission: EM | Admit: 2014-09-23 | Discharge: 2014-09-26 | DRG: 443 | Disposition: A | Discharge status: Home / Self Care | Payer: Self-pay | Attending: Internal Medicine | Admitting: Internal Medicine

## 2014-09-23 DIAGNOSIS — F17210 Nicotine dependence, cigarettes, uncomplicated: Secondary | ICD-10-CM | POA: Diagnosis present

## 2014-09-23 DIAGNOSIS — E860 Dehydration: Secondary | ICD-10-CM | POA: Diagnosis present

## 2014-09-23 DIAGNOSIS — B169 Acute hepatitis B without delta-agent and without hepatic coma: Principal | ICD-10-CM | POA: Diagnosis present

## 2014-09-23 DIAGNOSIS — R7401 Elevation of levels of liver transaminase levels: Secondary | ICD-10-CM | POA: Diagnosis present

## 2014-09-23 DIAGNOSIS — Z716 Tobacco abuse counseling: Secondary | ICD-10-CM | POA: Diagnosis present

## 2014-09-23 DIAGNOSIS — K759 Inflammatory liver disease, unspecified: Secondary | ICD-10-CM

## 2014-09-23 DIAGNOSIS — R740 Nonspecific elevation of levels of transaminase and lactic acid dehydrogenase [LDH]: Secondary | ICD-10-CM

## 2014-09-23 DIAGNOSIS — R112 Nausea with vomiting, unspecified: Secondary | ICD-10-CM | POA: Diagnosis present

## 2014-09-23 DIAGNOSIS — R9389 Abnormal findings on diagnostic imaging of other specified body structures: Secondary | ICD-10-CM

## 2014-09-23 DIAGNOSIS — R1110 Vomiting, unspecified: Secondary | ICD-10-CM

## 2014-09-23 DIAGNOSIS — B179 Acute viral hepatitis, unspecified: Secondary | ICD-10-CM | POA: Diagnosis present

## 2014-09-23 DIAGNOSIS — R748 Abnormal levels of other serum enzymes: Secondary | ICD-10-CM | POA: Diagnosis present

## 2014-09-23 LAB — URINE MICROSCOPIC-ADD ON

## 2014-09-23 LAB — COMPREHENSIVE METABOLIC PANEL
ALBUMIN: 3.2 g/dL — AB (ref 3.5–5.0)
ALK PHOS: 247 U/L — AB (ref 38–126)
ALT: 2722 U/L — ABNORMAL HIGH (ref 17–63)
ANION GAP: 7 (ref 5–15)
AST: 2053 U/L — AB (ref 15–41)
BILIRUBIN TOTAL: 7.9 mg/dL — AB (ref 0.3–1.2)
BUN: 7 mg/dL (ref 6–20)
CHLORIDE: 103 mmol/L (ref 101–111)
CO2: 25 mmol/L (ref 22–32)
Calcium: 8.2 mg/dL — ABNORMAL LOW (ref 8.9–10.3)
Creatinine, Ser: 1.14 mg/dL (ref 0.61–1.24)
GFR calc Af Amer: >60 mL/min (ref >60)
GFR calc non Af Amer: >60 mL/min (ref >60)
Glucose, Bld: 96 mg/dL (ref 65–99)
POTASSIUM: 3.6 mmol/L (ref 3.5–5.1)
SODIUM: 135 mmol/L (ref 135–145)
TOTAL PROTEIN: 6.8 g/dL (ref 6.5–8.1)

## 2014-09-23 LAB — PROTIME-INR
INR: 1.25 (ref 0.00–1.49)
Prothrombin Time: 15.9 seconds — ABNORMAL HIGH (ref 11.6–15.2)

## 2014-09-23 LAB — CBC WITH DIFFERENTIAL/PLATELET
BASOS ABS: 0.2 10*3/uL — AB (ref 0.0–0.1)
Basophils Relative: 2 % — ABNORMAL HIGH (ref 0–1)
EOS PCT: 1 % (ref 0–5)
Eosinophils Absolute: 0.1 10*3/uL (ref 0.0–0.7)
HCT: 46.3 % (ref 39.0–52.0)
Hemoglobin: 15.9 g/dL (ref 13.0–17.0)
LYMPHS ABS: 4.5 10*3/uL — AB (ref 0.7–4.0)
Lymphocytes Relative: 38 % (ref 12–46)
MCH: 31.6 pg (ref 26.0–34.0)
MCHC: 34.3 g/dL (ref 30.0–36.0)
MCV: 92 fL (ref 78.0–100.0)
MONOS PCT: 21 % — AB (ref 3–12)
Monocytes Absolute: 2.5 10*3/uL — ABNORMAL HIGH (ref 0.1–1.0)
NEUTROS PCT: 38 % — AB (ref 43–77)
Neutro Abs: 4.5 10*3/uL (ref 1.7–7.7)
PLATELETS: 187 10*3/uL (ref 150–400)
RBC: 5.03 MIL/uL (ref 4.22–5.81)
RDW: 13.7 % (ref 11.5–15.5)
WBC: 11.8 10*3/uL — AB (ref 4.0–10.5)

## 2014-09-23 LAB — TSH: TSH: 0.233 u[IU]/mL — ABNORMAL LOW (ref 0.350–4.500)

## 2014-09-23 LAB — I-STAT TROPONIN, ED: TROPONIN I, POC: 0 ng/mL (ref 0.00–0.08)

## 2014-09-23 LAB — URINALYSIS, ROUTINE W REFLEX MICROSCOPIC
Glucose, UA: NEGATIVE mg/dL
HGB URINE DIPSTICK: NEGATIVE
KETONES UR: 15 mg/dL — AB
NITRITE: POSITIVE — AB
Protein, ur: 100 mg/dL — AB
SPECIFIC GRAVITY, URINE: 1.034 — AB (ref 1.005–1.030)
Urobilinogen, UA: 1 mg/dL (ref 0.0–1.0)
pH: 5.5 (ref 5.0–8.0)

## 2014-09-23 LAB — CBC
HEMATOCRIT: 42.8 % (ref 39.0–52.0)
Hemoglobin: 14.7 g/dL (ref 13.0–17.0)
MCH: 31.5 pg (ref 26.0–34.0)
MCHC: 34.3 g/dL (ref 30.0–36.0)
MCV: 91.8 fL (ref 78.0–100.0)
Platelets: 189 10*3/uL (ref 150–400)
RBC: 4.66 MIL/uL (ref 4.22–5.81)
RDW: 13.8 % (ref 11.5–15.5)
WBC: 9.8 10*3/uL (ref 4.0–10.5)

## 2014-09-23 LAB — CREATININE, SERUM
Creatinine, Ser: 1.14 mg/dL (ref 0.61–1.24)
GFR calc non Af Amer: >60 mL/min (ref >60)

## 2014-09-23 LAB — LIPASE, BLOOD: Lipase: 35 U/L (ref 22–51)

## 2014-09-23 MED ORDER — SODIUM CHLORIDE 0.9 % IV BOLUS (SEPSIS)
1000.0000 mL | Freq: Once | INTRAVENOUS | Status: AC
  Administered 2014-09-23: 1000 mL via INTRAVENOUS

## 2014-09-23 MED ORDER — SODIUM CHLORIDE 0.9 % IV SOLN
INTRAVENOUS | Status: AC
  Administered 2014-09-23: 11:00:00 via INTRAVENOUS

## 2014-09-23 MED ORDER — ONDANSETRON HCL 4 MG/2ML IJ SOLN: 4.0000 mg | Freq: Three times a day (TID) | INTRAMUSCULAR | Status: DC | PRN

## 2014-09-23 MED ORDER — ALUM & MAG HYDROXIDE-SIMETH 200-200-20 MG/5ML PO SUSP
30.0000 mL | Freq: Four times a day (QID) | ORAL | Status: DC | PRN
Start: 2014-09-23 — End: 2014-09-26
  Administered 2014-09-25 – 2014-09-26 (×2): 30 mL via ORAL
  Filled 2014-09-23 (×2): qty 30

## 2014-09-23 MED ORDER — NICOTINE 21 MG/24HR TD PT24
21.0000 mg | MEDICATED_PATCH | Freq: Every day | TRANSDERMAL | Status: DC
  Filled 2014-09-23 (×4): qty 1

## 2014-09-23 MED ORDER — SODIUM CHLORIDE 0.9 % IV SOLN
INTRAVENOUS | Status: DC
Start: 2014-09-23 — End: 2014-09-24
  Administered 2014-09-23 – 2014-09-24 (×2): via INTRAVENOUS

## 2014-09-23 MED ORDER — HEPARIN SODIUM (PORCINE) 5000 UNIT/ML IJ SOLN
5000.0000 [IU] | Freq: Three times a day (TID) | INTRAMUSCULAR | Status: DC
  Administered 2014-09-23 – 2014-09-25 (×7): 5000 [IU] via SUBCUTANEOUS
  Filled 2014-09-23 (×11): qty 1

## 2014-09-23 MED ORDER — ONDANSETRON HCL 4 MG PO TABS: 4.0000 mg | ORAL_TABLET | Freq: Four times a day (QID) | ORAL | Status: DC | PRN

## 2014-09-23 MED ORDER — ONDANSETRON HCL 4 MG/2ML IJ SOLN: 4.0000 mg | Freq: Four times a day (QID) | INTRAMUSCULAR | Status: DC | PRN

## 2014-09-23 MED ORDER — HYDROMORPHONE HCL 1 MG/ML IJ SOLN
1.0000 mg | INTRAMUSCULAR | Status: AC | PRN
  Administered 2014-09-23: 1 mg via INTRAVENOUS
  Filled 2014-09-23: qty 1

## 2014-09-23 MED ORDER — HYDROMORPHONE HCL 1 MG/ML IJ SOLN
1.0000 mg | INTRAMUSCULAR | Status: DC | PRN
  Administered 2014-09-24 – 2014-09-25 (×5): 1 mg via INTRAVENOUS
  Filled 2014-09-23 (×5): qty 1

## 2014-09-23 MED ORDER — ACETAMINOPHEN 650 MG RE SUPP: 650.0000 mg | Freq: Four times a day (QID) | RECTAL | Status: DC | PRN

## 2014-09-23 MED ORDER — ACETAMINOPHEN 325 MG PO TABS: 650.0000 mg | ORAL_TABLET | Freq: Four times a day (QID) | ORAL | Status: DC | PRN

## 2014-09-23 MED ORDER — ONDANSETRON HCL 4 MG/2ML IJ SOLN
4.0000 mg | Freq: Once | INTRAMUSCULAR | Status: AC
  Administered 2014-09-23: 4 mg via INTRAVENOUS
  Filled 2014-09-23: qty 2

## 2014-09-23 NOTE — ED Notes (Signed)
Attempt to call report x 1  

## 2014-09-23 NOTE — ED Notes (Signed)
US called - will put in transport order to transport pt to 6E

## 2014-09-23 NOTE — ED Notes (Signed)
Called bed placement: pt bed assignment has changed - will now be going to another unit on floor.

## 2014-09-23 NOTE — ED Notes (Signed)
EDP at bedside  

## 2014-09-23 NOTE — ED Notes (Signed)
Placed on enteric precautions d/t possible hepatitis A

## 2014-09-23 NOTE — ED Notes (Signed)
Report given to RN on 6E - pt will be transported to 6E after finished at [place]

## 2014-09-23 NOTE — ED Notes (Signed)
Patient transported to Ultrasound - Ultrasound will call after he is done so he can be taken upstairs from there.

## 2014-09-23 NOTE — ED Notes (Signed)
Pt c/o abdominal pain, nausea/vomiting x3 days. Unable to tolerate PO food/fluid until this morning. Pt c/o  "cramping pain" in middle of abdomen.

## 2014-09-23 NOTE — ED Provider Notes (Signed)
CSN: [id number]     Arrival date & time 09/23/14  0712 History   First MD Initiated Contact with Patient 09/23/14 [phone]     Chief Complaint  Patient presents with  . Abdominal Pain     (Consider location/radiation/quality/duration/timing/severity/associated sxs/prior Treatment) HPI Comments: Patient with no past surgical history presents with complaint of epigastric pain without radiation, nausea, vomiting 3 days. Patient has not had pain like this in the past. He is unable to tolerate fluids or liquids. Last episode of vomiting was last night. Patient denies alcohol use in one month. No heavy NSAID use. Patient states that when he lies flat he can feel pain in his chest and back, but this is not persistent. No shortness of breath. Patient denies fever. Last bowel movement was yesterday, nonbloody, no diarrhea. Patient states that his urine is very dark. No treatments prior to arrival. No known sick contacts. Onset of symptoms acute. Course is constant. Nothing makes symptoms better or worse.  Patient denies IVDU, foreign travel. He is sexually active with women, last contact 2-3 months ago, states he uses barrier protection (condoms). No history of hepatitis.   Patient is a 39 y.o. male presenting with abdominal pain. The history is provided by the patient.  Abdominal Pain Associated symptoms: chest pain, nausea and vomiting   Associated symptoms: no constipation, no cough, no diarrhea, no dysuria, no fever and no sore throat     History reviewed. No pertinent past medical history. History reviewed. No pertinent past surgical history. History reviewed. No pertinent family history. History  Substance Use Topics  . Smoking status: Current Every Day Smoker -- 0.50 packs/day    Types: Cigarettes  . Smokeless tobacco: Not on file  . Alcohol Use: No     Comment: beer/day    Review of Systems  Constitutional: Negative for fever.  HENT: Negative for rhinorrhea and sore throat.   Eyes:  Negative for redness.  Respiratory: Negative for cough.   Cardiovascular: Positive for chest pain.  Gastrointestinal: Positive for nausea, vomiting and abdominal pain. Negative for diarrhea, constipation and blood in stool.  Genitourinary: Positive for decreased urine volume. Negative for dysuria.  Musculoskeletal: Positive for back pain. Negative for myalgias.  Skin: Negative for rash.  Neurological: Negative for headaches.      Allergies  Review of patient's allergies indicates no known allergies.  Home Medications   Prior to Admission medications   Not on File   BP 127/99 mmHg  Pulse 85  Temp(Src) 98.4 F (36.9 C) (Oral)  Resp 18  SpO2 99%   Physical Exam  Constitutional: He appears well-developed and well-nourished.  HENT:  Head: Normocephalic and atraumatic.  Mouth/Throat: Oropharynx is clear and moist.  Eyes: Right eye exhibits no discharge. Left eye exhibits no discharge.  Jaundice.  Neck: Normal range of motion. Neck supple.  Cardiovascular: Normal rate, regular rhythm and normal heart sounds.   No murmur heard. Pulmonary/Chest: Effort normal and breath sounds normal. No respiratory distress. He has no wheezes. He has no rales.  Abdominal: Soft. He exhibits no distension. There is tenderness in the right upper quadrant and epigastric area. There is no rigidity, no rebound, no guarding, no tenderness at McBurney's point and negative Murphy's sign.    Neurological: He is alert.  Skin: Skin is warm and dry.  Psychiatric: He has a normal mood and affect.  Nursing note and vitals reviewed.   ED Course  Procedures (including critical care time) Labs Review Labs Reviewed  CBC WITH  DIFFERENTIAL/PLATELET - Abnormal; Notable for the following:    WBC 11.8 (*)    Neutrophils Relative % 38 (*)    Monocytes Relative 21 (*)    Basophils Relative 2 (*)    Lymphs Abs 4.5 (*)    Monocytes Absolute 2.5 (*)    Basophils Absolute 0.2 (*)    All other components within  normal limits  COMPREHENSIVE METABOLIC PANEL - Abnormal; Notable for the following:    Calcium 8.2 (*)    Albumin 3.2 (*)    AST 2053 (*)    ALT 2722 (*)    Alkaline Phosphatase 247 (*)    Total Bilirubin 7.9 (*)    All other components within normal limits  URINALYSIS, ROUTINE W REFLEX MICROSCOPIC (NOT [facility]) - Abnormal; Notable for the following:    Color, Urine ORANGE (*)    Specific Gravity, Urine 1.034 (*)    Bilirubin Urine LARGE (*)    Ketones, ur 15 (*)    Protein, ur 100 (*)    Nitrite POSITIVE (*)    Leukocytes, UA SMALL (*)    All other components within normal limits  URINE MICROSCOPIC-ADD ON - Abnormal; Notable for the following:    Bacteria, UA MANY (*)    All other components within normal limits  LIPASE, BLOOD  HEPATITIS PANEL, ACUTE  I-STAT TROPOININ, ED    Imaging Review No results found.   EKG Interpretation   Date/Time:  Tuesday September 23 2014 07:37:45 EDT Ventricular Rate:  80 PR Interval:  155 QRS Duration: 80 QT Interval:  380 QTC Calculation: 438 R Axis:   84 Text Interpretation:  Sinus rhythm Probable left atrial enlargement ST  elev, probable normal early repol pattern No significant change since last  tracing Confirmed by BEATON  MD, ROBERT ([id number]) on 09/23/2014 7:48:23 AM       8:08 AM Patient seen and examined. Work-up initiated. Medications ordered.   Vital signs reviewed and are as follows: BP 127/99 mmHg  Pulse 85  Temp(Src) 98.4 F (36.9 C) (Oral)  Resp 18  SpO2 99%  9:53 AM Patient with acute hepatitis. Will admit. Patient discussed with Dr. Radford PaxBeaton. Enteric contact precautions ordered.   10:14 AM Spoke with Dr. Isidoro Donningai who will admit. Holding orders placed.   MDM   Final diagnoses:  Hepatitis  Non-intractable vomiting with nausea, vomiting of unspecified type   Admit, no reliable f/u, new acute hepatitis, unclear etiology.     Renne CriglerJoshua Heraclio Seidman, PA-C 09/23/14 1014  Nelva Nayobert Beaton, MD 09/23/14 1018

## 2014-09-23 NOTE — H&P (Signed)
History and Physical        Hospital Admission Note Date: 09/23/2014  Patient name: Troy AxonSamuel E Gilleland Medical record number: [id number] Date of birth: 1975-04-06 Age: 39 y.o. Gender: male  PCP: No PCP Per Patient  Referring physician: Renne CriglerJoshua Geiple, PA  Chief Complaint:  Epigastric pain, Nausea and vomiting, dark color urine   HPI: Patient is a 39 year old male with no past medical history presented to ED with epigastric pain, nausea and vomiting for last 3 days. History was obtained from the patient who reported that he had barbecue cook out at his work on Friday, he had a couple of burgers and pork. He had no alcohol, has quit drinking one month ago. Next morning, he started having intractable nausea and vomiting unable to hold anything down since then. He denied any fevers or chills. He also reported periumbilical abdominal pain,6/10, intermittent, worse on movement. Otherwise patient denies any recent travels. He is sexually active but uses protection. No prior history of hepatitis. Patient reported that he noticed dark colored urine in the last 2 days. He denies any diarrhea. EDP note reviewed, labs showed lipase 35, AST 2053, ALT 2722, total bilirubin 7.9  Review of Systems:  Constitutional: Denies fever, chills, diaphoresis, + poor appetite and fatigue.  HEENT: Denies photophobia, eye pain, redness, hearing loss, ear pain, congestion, sore throat, rhinorrhea, sneezing, mouth sores, trouble swallowing, neck pain, neck stiffness and tinnitus.   Respiratory: Denies SOB, DOE, cough, chest tightness,  and wheezing.   Cardiovascular: Denies chest pain, palpitations and leg swelling.  Gastrointestinal: Please see history of present illness  Genitourinary: Denies dysuria, urgency, frequency, hematuria, flank pain and difficulty urinating.  dark color urine Musculoskeletal: Denies  myalgias, back pain, joint swelling, arthralgias and gait problem.  Skin: Denies pallor, rash and wound.  Neurological: Denies dizziness, seizures, syncope, weakness, light-headedness, numbness and headaches.  Hematological: Denies adenopathy. Easy bruising, personal or family bleeding history  Psychiatric/Behavioral: Denies suicidal ideation, mood changes, confusion, nervousness, sleep disturbance and agitation  Past Medical History: History reviewed. No pertinent past medical history.  History reviewed. No pertinent past surgical history.  Medications: Prior to Admission medications   Not on File    Allergies:  No Known Allergies  Social History:  reports that he has been smoking Cigarettes.  He has been smoking about 0.50 packs per day. He does not have any smokeless tobacco history on file. He reports that he does not drink alcohol or use illicit drugs.  Family History: History reviewed. Patient reported CAD in his father, seizures( father) otherwise no history of hepatitis or liver cancers in the family  Physical Exam: Blood pressure 107/69, pulse 70, temperature 98.4 F (36.9 C), temperature source Oral, resp. rate 21, SpO2 100 %. General: Alert, awake, oriented x3, in no acute distress. HEENT: normocephalic, atraumatic, icteric sclera, pink conjunctiva, pupils equal and reactive to light and accomodation, oropharynx clear Neck: supple, no masses or lymphadenopathy, no goiter, no bruits  Heart: Regular rate and rhythm, without murmurs, rubs or gallops. Lungs: Clear to auscultation bilaterally, no wheezing, rales or rhonchi. Abdomen: Soft, mildly tender in the epigastric and periumbilical area, no rebound or guarding,nondistended, positive bowel sounds, no  masses. Extremities: No clubbing, cyanosis or edema with positive pedal pulses. Neuro: Grossly intact, no focal neurological deficits, strength 5/5 upper and lower extremities bilaterally Psych: alert and oriented x 3,  normal mood and affect Skin: no rashes or lesions, warm and dry   LABS on Admission:  Basic Metabolic Panel:  Recent Labs Lab 09/23/14 0750  NA 135  K 3.6  CL 103  CO2 25  GLUCOSE 96  BUN 7  CREATININE 1.14  CALCIUM 8.2*   Liver Function Tests:  Recent Labs Lab 09/23/14 0750  AST 2053*  ALT 2722*  ALKPHOS 247*  BILITOT 7.9*  PROT 6.8  ALBUMIN 3.2*    Recent Labs Lab 09/23/14 0750  LIPASE 35   No results for input(s): AMMONIA in the last 168 hours. CBC:  Recent Labs Lab 09/23/14 0750  WBC 11.8*  NEUTROABS 4.5  HGB 15.9  HCT 46.3  MCV 92.0  PLT 187   Cardiac Enzymes: No results for input(s): CKTOTAL, CKMB, CKMBINDEX, TROPONINI in the last 168 hours. BNP: Invalid input(s): POCBNP CBG: No results for input(s): GLUCAP in the last 168 hours.  Radiological Exams on Admission:  No results found.  *I have personally reviewed the images above*     Assessment/Plan Principal Problem:   Acute transaminitis: Lipase normal - Admit to MedSurg, supportive treatment, placed on aggressive IV fluid hydration, pain control - Obtain acute hepatitis panel, coag panel, right upper quadrant ultrasound, may need GI consultation pending results - Patient reports that he does not drink anymore, last drink was one month ago   Active Problems:   Nausea and vomiting - Likely due to #1, Place on Zofran as needed, IV fluid hydration  Dehydration - Continue IV fluid hydration, UA positive for ketones - Repeat UA   DVT prophylaxis: Heparin subcutaneous  CODE STATUS: Full code   Family Communication: Admission, patients condition and plan of care including tests being ordered have been discussed with the patient who indicates understanding and agree with the plan and Code Status  Disposition plan: Further plan will depend as patient's clinical course evolves and further radiologic and laboratory data become available.   Time Spent on Admission: 50 mins    Tanashia Ciesla M.D. [facility] 09/23/2014, 10:51 AM Pager: [phone]  If 7PM-7AM, please contact night-coverage [email or web address] Password [email or web address]

## 2014-09-24 ENCOUNTER — Inpatient Hospital Stay (HOSPITAL_COMMUNITY): Payer: Self-pay

## 2014-09-24 ENCOUNTER — Encounter (HOSPITAL_COMMUNITY): Payer: Self-pay

## 2014-09-24 DIAGNOSIS — K7200 Acute and subacute hepatic failure without coma: Secondary | ICD-10-CM

## 2014-09-24 DIAGNOSIS — R740 Nonspecific elevation of levels of transaminase and lactic acid dehydrogenase [LDH]: Secondary | ICD-10-CM

## 2014-09-24 DIAGNOSIS — G43A0 Cyclical vomiting, not intractable: Secondary | ICD-10-CM

## 2014-09-24 LAB — CBC
HEMATOCRIT: 41.1 % (ref 39.0–52.0)
Hemoglobin: 13.8 g/dL (ref 13.0–17.0)
MCH: 30.7 pg (ref 26.0–34.0)
MCHC: 33.6 g/dL (ref 30.0–36.0)
MCV: 91.5 fL (ref 78.0–100.0)
PLATELETS: 185 10*3/uL (ref 150–400)
RBC: 4.49 MIL/uL (ref 4.22–5.81)
RDW: 14 % (ref 11.5–15.5)
WBC: 8.6 10*3/uL (ref 4.0–10.5)

## 2014-09-24 LAB — COMPREHENSIVE METABOLIC PANEL
ALT: 2161 U/L — ABNORMAL HIGH (ref 17–63)
AST: 1534 U/L — AB (ref 15–41)
Albumin: 2.5 g/dL — ABNORMAL LOW (ref 3.5–5.0)
Alkaline Phosphatase: 186 U/L — ABNORMAL HIGH (ref 38–126)
Anion gap: 5 (ref 5–15)
BILIRUBIN TOTAL: 7.5 mg/dL — AB (ref 0.3–1.2)
BUN: <5 mg/dL — ABNORMAL LOW (ref 6–20)
CO2: 26 mmol/L (ref 22–32)
Calcium: 7.8 mg/dL — ABNORMAL LOW (ref 8.9–10.3)
Chloride: 105 mmol/L (ref 101–111)
Creatinine, Ser: 1 mg/dL (ref 0.61–1.24)
GFR calc Af Amer: >60 mL/min (ref >60)
GFR calc non Af Amer: >60 mL/min (ref >60)
GLUCOSE: 86 mg/dL (ref 65–99)
Potassium: 3.6 mmol/L (ref 3.5–5.1)
SODIUM: 136 mmol/L (ref 135–145)
Total Protein: 5.4 g/dL — ABNORMAL LOW (ref 6.5–8.1)

## 2014-09-24 LAB — ACETAMINOPHEN LEVEL: Acetaminophen (Tylenol), Serum: <10 ug/mL — ABNORMAL LOW (ref 10–30)

## 2014-09-24 LAB — HEMOGLOBIN A1C
HEMOGLOBIN A1C: 5.9 % — AB (ref 4.8–5.6)
MEAN PLASMA GLUCOSE: 123 mg/dL

## 2014-09-24 LAB — HEPATITIS PANEL, ACUTE
HCV Ab: <0.1 s/co ratio (ref 0.0–0.9)
HEP A IGM: NEGATIVE
HEP A IGM: NEGATIVE
HEP B C IGM: POSITIVE — AB
HEP B C IGM: POSITIVE — AB
Hepatitis B Surface Ag: POSITIVE — AB
Hepatitis B Surface Ag: POSITIVE — AB

## 2014-09-24 LAB — T4, FREE: Free T4: 1.18 ng/dL — ABNORMAL HIGH (ref 0.61–1.12)

## 2014-09-24 LAB — RAPID URINE DRUG SCREEN, HOSP PERFORMED
AMPHETAMINES: NOT DETECTED
BARBITURATES: NOT DETECTED
BENZODIAZEPINES: NOT DETECTED
COCAINE: NOT DETECTED
Opiates: NOT DETECTED
Tetrahydrocannabinol: NOT DETECTED

## 2014-09-24 MED ORDER — IOHEXOL 300 MG/ML  SOLN: 50.0000 mL | Freq: Once | INTRAMUSCULAR | Status: AC | PRN

## 2014-09-24 MED ORDER — IOHEXOL 300 MG/ML  SOLN
100.0000 mL | Freq: Once | INTRAMUSCULAR | Status: AC | PRN
  Administered 2014-09-24: 100 mL via INTRAVENOUS

## 2014-09-24 MED ORDER — SODIUM CHLORIDE 0.9 % IV SOLN
INTRAVENOUS | Status: AC
  Administered 2014-09-24 (×2): via INTRAVENOUS

## 2014-09-24 NOTE — Consult Note (Signed)
Subjective:   HPI  The patient is a 39 year old male who was admitted to the hospital yesterday because of nausea and vomiting and upper abdominal pain which started about 3 days ago. He was found to have elevated liver enzymes in the emergency room and subsequent hepatitis panel reveals a positive hepatitis B surface antigen, positive hepatitis B core antibody IgM, negative hepatitis a, negative hepatitis C. Total bilirubin is 7.9, alkaline phosphatase 247, AST 2053, ALT 2722. The patient was informed of the positive findings of hepatitis B. He thinks he probably caught it from his girlfriend. He denies ever having had a blood transfusion. He denies IV drugs. He is not a heavy drinker of alcohol.  Review of Systems Denies chest pain or shortness of breath History reviewed. No pertinent past medical history. History reviewed. No pertinent past surgical history. History   Social History  . Marital Status: Married    Spouse Name: N/A  . Number of Children: N/A  . Years of Education: N/A   Occupational History  . Not on file.   Social History Main Topics  . Smoking status: Current Every Day Smoker -- 0.50 packs/day    Types: Cigarettes  . Smokeless tobacco: Not on file  . Alcohol Use: No     Comment: beer/day  . Drug Use: No  . Sexual Activity: Yes   Other Topics Concern  . Not on file   Social History Narrative   family history is not on file.  Current facility-administered medications:  .  0.9 %  sodium chloride infusion, , Intravenous, Continuous, Leroy Sea, MD, Last Rate: 75 mL/hr at 09/24/14 0816 .  acetaminophen (TYLENOL) tablet 650 mg, 650 mg, Oral, Q6H PRN **OR** acetaminophen (TYLENOL) suppository 650 mg, 650 mg, Rectal, Q6H PRN, Ripudeep K Rai, MD .  alum & mag hydroxide-simeth (MAALOX/MYLANTA) 200-200-20 MG/5ML suspension 30 mL, 30 mL, Oral, Q6H PRN, Ripudeep K Rai, MD .  heparin injection 5,000 Units, 5,000 Units, Subcutaneous, 3 times per day, Ripudeep Jenna Luo,  MD, 5,000 Units at 09/24/14 8119 .  HYDROmorphone (DILAUDID) injection 1 mg, 1 mg, Intravenous, Q4H PRN, Ripudeep K Rai, MD, 1 mg at 09/24/14 0807 .  nicotine (NICODERM CQ - dosed in mg/24 hours) patch 21 mg, 21 mg, Transdermal, Daily, Ripudeep K Rai, MD, 21 mg at 09/23/14 1703 .  ondansetron (ZOFRAN) tablet 4 mg, 4 mg, Oral, Q6H PRN **OR** ondansetron (ZOFRAN) injection 4 mg, 4 mg, Intravenous, Q6H PRN, Ripudeep K Rai, MD No Known Allergies   Objective:     BP 126/80 mmHg  Pulse 58  Temp(Src) 98.3 F (36.8 C) (Oral)  Resp 18  Ht  (1.727 m)  Wt 66.361 kg (146 lb 4.8 oz)  BMI 22.25 kg/m2  SpO2 98%  He does not appear in any acute distress  Scleral icterus  Heart regular rhythm no murmurs  Lungs clear  Abdomen: Bowel sounds normal, soft, nontender, no obvious hepatosplenomegaly  Abdominal ultrasound does not show any gallstones but does show a fluid collection under the liver etiology unclear  Laboratory No components found for: D1    Assessment:     Acute hepatitis B  Abnormal abdominal ultrasound of uncertain etiology with findings of fluid collection under the liver and a contracted gallbladder      Plan:     At the present time with this new diagnosis of acute hepatitis the I would recommend supportive care. I would recommend following his liver enzymes. Following his PT and INR. Watch  for any signs of liver decompensation. Other than supportive care at this time I do not think he needs any specific treatment. He is hungry and is asking to advance his diet. He feels that he can tolerate it and therefore I will advance his diet. I would recommend doing a CT of the abdomen to follow up on the abnormal findings of abdominal ultrasound.

## 2014-09-24 NOTE — Progress Notes (Signed)
Patient Demographics:    Troy Barnett, is a 39 y.o. male, DOB - 09/24/1975, ZOX:[id number]  Admit date - 09/23/2014   Admitting Physician Ripudeep Jenna Luo, MD  Outpatient Primary MD for the patient is No PCP Per Patient  LOS - 1   Chief Complaint  Patient presents with  . Abdominal Pain        Subjective:    Levell July today has, No headache, No chest pain, No abdominal pain - No Nausea, No new weakness tingling or numbness, No Cough - SOB.     Assessment  & Plan :    1. Abdominal pain with evidence of acute hepatitis B infection and elevated liver enzymes, nonspecific fluid collection under the gallbladder fossa. GI on board, overall patient much better, diet advanced by GI. CT abdomen and pelvis ordered to better evaluate the fluid collection and to rule out abscess. Hep C negative, HIV ordered. Patient denies any risk factors for hep B.   2. Abdominal pain with nausea and vomiting. Due to #1 above. Hydrate with IV fluids and supportive care. Pain has resolved.   3. Mildly low TSH. Repeat TSH in the morning along with free T3 and T4.     Code Status : Full  Family Communication  : None  Disposition Plan  : Home in 2- 3 days  Consults  :  GI  Procedures  :   Right upper quadrant ultrasound showing some fluid collection in the gallbladder of unclear significance  CT abdomen pelvis ordered  DVT Prophylaxis  :   Heparin   Lab Results  Component Value Date   PLT 185 09/24/2014    Inpatient Medications  Scheduled Meds: . heparin  5,000 Units Subcutaneous 3 times per day  . nicotine  21 mg Transdermal Daily   Continuous Infusions: . sodium chloride 75 mL/hr at 09/24/14 0816   PRN Meds:.acetaminophen **OR** acetaminophen, alum & mag hydroxide-simeth, HYDROmorphone (DILAUDID)  injection, ondansetron **OR** ondansetron (ZOFRAN) IV  Antibiotics  :    Anti-infectives    None        Objective:   Filed Vitals:   09/23/14 1243 09/23/14 1651 09/23/14 2039 09/24/14 0907  BP: 131/81 136/87 135/86 126/80  Pulse: 64 65 78 58  Temp: 98.1 F (36.7 C) 99.2 F (37.3 C) 99.1 F (37.3 C) 98.3 F (36.8 C)  TempSrc: Oral Oral Oral Oral  Resp: Height:  (1.727 m)     Weight: 65.046 kg (143 lb 6.4 oz)  66.361 kg (146 lb 4.8 oz)   SpO2: 99% 100% 98% 98%    Wt Readings from Last 3 Encounters:  09/23/14 66.361 kg (146 lb 4.8 oz)  08/24/12 68.947 kg (152 lb)     Intake/Output Summary (Last 24 hours) at 09/24/14 1229 Last data filed at 09/24/14 0600  Gross per 24 hour  Intake 3129.5 ml  Output    200 ml  Net 2929.5 ml     Physical Exam  Awake Alert, Oriented X 3, No new F.N deficits, Normal affect .AT,PERRAL Supple Neck,No JVD, No cervical lymphadenopathy appriciated.  Symmetrical Chest wall movement, Good air movement bilaterally, CTAB RRR,No Gallops,Rubs or new Murmurs, No Parasternal Heave +ve B.Sounds, Abd Soft, No tenderness,  No organomegaly appriciated, No rebound - guarding or rigidity. No Cyanosis, Clubbing or edema, No new Rash or bruise      Data Review:   Micro Results No results found for this or any previous visit (from the past 240 hour(s)).  Radiology Reports US Abdomen Complete  09/23/2014   CLINICAL DATA:  Elevated liver function studies, 3 days of abdominal pain.  EXAM: ULTRASOUND ABDOMEN COMPLETE  COMPARISON:  None.  FINDINGS: Gallbladder: The gallbladder is contracted. Measurement of the gallbladder wall is difficult. There are no discrete stones. There is a positive sonographic Murphy's sign.  Common bile duct: Diameter: 2.4 mm  Liver: The hepatic echotexture is normal. There is no focal mass or ductal dilation. Inferior to the liver and gallbladder fossa there is fluid-like echogenic material. The patient is tender  in this region.  IVC: No abnormality visualized.  Pancreas: Visualized portion unremarkable.  Spleen: Size and appearance within normal limits.  Right Kidney: Length: 10 cm. Echogenicity within normal limits. No mass or hydronephrosis visualized.  Left Kidney: Length: 10 cm. Echogenicity within normal limits. No mass or hydronephrosis visualized.  Abdominal aorta: No aneurysm visualized.  Other findings: Other than the echogenic fluid collection inferior to the liver no free fluid is observed.  IMPRESSION: 1. Contracted gallbladder with positive sonographic Murphy's sign suspicious for acute cholecystitis. No stones are evident. 2. Abnormal fluid collection containing internal echoes inferior to the liver and gallbladder fossa but possibly related to the gallbladder. This could reflect an abscess. Abdominal and pelvic CT scanning now is recommended to further evaluate this finding.   Electronically Signed   By: David  [place] M.D.   On: 09/23/2014 12:05     CBC  Recent Labs Lab 09/23/14 0750 09/23/14 1335 09/24/14 0545  WBC 11.8* 9.8 8.6  HGB 15.9 14.7 13.8  HCT 46.3 42.8 41.1  PLT 187 189 185  MCV 92.0 91.8 91.5  MCH 31.6 31.5 30.7  MCHC 34.3 34.3 33.6  RDW 13.7 13.8 14.0  LYMPHSABS 4.5*  --   --   MONOABS 2.5*  --   --   EOSABS 0.1  --   --   BASOSABS 0.2*  --   --     Chemistries   Recent Labs Lab 09/23/14 0750 09/23/14 1335 09/24/14 0545  NA 135  --  136  K 3.6  --  3.6  CL 103  --  105  CO2 25  --  26  GLUCOSE 96  --  86  BUN 7  --  <5*  CREATININE 1.14 1.14 1.00  CALCIUM 8.2*  --  7.8*  AST 2053*  --  1534*  ALT 2722*  --  2161*  ALKPHOS 247*  --  186*  BILITOT 7.9*  --  7.5*   ------------------------------------------------------------------------------------------------------------------ estimated creatinine clearance is 94.1 mL/min (by C-G formula based on Cr of  1). ------------------------------------------------------------------------------------------------------------------  Recent Labs  09/23/14 1335  HGBA1C 5.9*   ------------------------------------------------------------------------------------------------------------------ No results for input(s): CHOL, HDL, LDLCALC, TRIG, CHOLHDL, LDLDIRECT in the last 72 hours. ------------------------------------------------------------------------------------------------------------------  Recent Labs  09/23/14 1335  TSH 0.233*   ------------------------------------------------------------------------------------------------------------------ No results for input(s): VITAMINB12, FOLATE, FERRITIN, TIBC, IRON, RETICCTPCT in the last 72 hours.  Coagulation profile  Recent Labs Lab 09/23/14 1335  INR 1.25    No results for input(s): DDIMER in the last 72 hours.  Cardiac Enzymes No results for input(s): CKMB, TROPONINI, MYOGLOBIN in the last 168 hours.  Invalid input(s): CK ------------------------------------------------------------------------------------------------------------------ Invalid input(s): POCBNP   Time  Spent in minutes   35   SINGH,PRASHANT K M.D on 09/24/2014 at 12:29 PM  Between 7am to 7pm - Pager - [phone]  After 7pm go to [email or web address] - password [email or web address]  [facility]   Office  [phone]    ]

## 2014-09-24 NOTE — Progress Notes (Signed)
Utilization review completed. Bralyn Espino, RN, BSN. 

## 2014-09-25 LAB — COMPREHENSIVE METABOLIC PANEL
ALBUMIN: 2.4 g/dL — AB (ref 3.5–5.0)
ALK PHOS: 174 U/L — AB (ref 38–126)
ALT: 1920 U/L — ABNORMAL HIGH (ref 17–63)
AST: 1403 U/L — AB (ref 15–41)
Anion gap: 3 — ABNORMAL LOW (ref 5–15)
BILIRUBIN TOTAL: 9.2 mg/dL — AB (ref 0.3–1.2)
BUN: <5 mg/dL — ABNORMAL LOW (ref 6–20)
CO2: 26 mmol/L (ref 22–32)
Calcium: 8 mg/dL — ABNORMAL LOW (ref 8.9–10.3)
Chloride: 107 mmol/L (ref 101–111)
Creatinine, Ser: 0.98 mg/dL (ref 0.61–1.24)
GFR calc Af Amer: >60 mL/min (ref >60)
Glucose, Bld: 91 mg/dL (ref 65–99)
POTASSIUM: 3.7 mmol/L (ref 3.5–5.1)
Sodium: 136 mmol/L (ref 135–145)
Total Protein: 5.5 g/dL — ABNORMAL LOW (ref 6.5–8.1)

## 2014-09-25 LAB — CBC
HCT: 40.9 % (ref 39.0–52.0)
HEMOGLOBIN: 13.9 g/dL (ref 13.0–17.0)
MCH: 31.4 pg (ref 26.0–34.0)
MCHC: 34 g/dL (ref 30.0–36.0)
MCV: 92.3 fL (ref 78.0–100.0)
Platelets: 196 10*3/uL (ref 150–400)
RBC: 4.43 MIL/uL (ref 4.22–5.81)
RDW: 14.2 % (ref 11.5–15.5)
WBC: 9.1 10*3/uL (ref 4.0–10.5)

## 2014-09-25 LAB — PATHOLOGIST SMEAR REVIEW

## 2014-09-25 LAB — URINE CULTURE: Culture: NO GROWTH

## 2014-09-25 LAB — PROTIME-INR
INR: 1.27 (ref 0.00–1.49)
PROTHROMBIN TIME: 16 s — AB (ref 11.6–15.2)

## 2014-09-25 LAB — T3, FREE: T3, Free: 2.7 pg/mL (ref 2.0–4.4)

## 2014-09-25 LAB — HIV ANTIBODY (ROUTINE TESTING W REFLEX): HIV Screen 4th Generation wRfx: NONREACTIVE

## 2014-09-25 LAB — TSH: TSH: 0.382 u[IU]/mL (ref 0.350–4.500)

## 2014-09-25 MED ORDER — SODIUM CHLORIDE 0.9 % IV SOLN
INTRAVENOUS | Status: AC
Start: 2014-09-25 — End: 2014-09-26
  Administered 2014-09-25 – 2014-09-26 (×2): via INTRAVENOUS

## 2014-09-25 NOTE — Progress Notes (Signed)
[facility] Progress Note  Subjective: Patient is feeling better today. He has a little discomfort in the right upper quadrant but not a tremendous amount. He is tolerating his diet.  Objective: Vital signs in last 24 hours: Temp:  [98.3 F (36.8 C)-98.7 F (37.1 C)] 98.3 F (36.8 C) (06/30 0502) Pulse Rate:  [52-73] 52 (06/30 0502) Resp:  [16-18] 16 (06/30 0502) BP: (121-138)/(70-85) 138/85 mmHg (06/30 0502) SpO2:  [98 %-100 %] 98 % (06/30 0502) Weight:  [69.31 kg (152 lb 12.8 oz)] 69.31 kg (152 lb 12.8 oz) (06/29 2102) Weight change: 4.264 kg (9 lb 6.4 oz)   PE:  No distress  Scleral icterus  Heart regular rhythm no murmurs  Lungs clear  Abdomen: Bowel sounds normal, soft, mild tenderness in epigastrium and right upper quadrant  Lab Results: Results for orders placed or performed during the hospital encounter of 09/23/14 (from the past 24 hour(s))  HIV antibody     Status: None   Collection Time: 09/24/14 12:00 PM  Result Value Ref Range   HIV Screen 4th Generation wRfx Non Reactive Non Reactive  T4, free     Status: Abnormal   Collection Time: 09/24/14  2:21 PM  Result Value Ref Range   Free T4 1.18 (H) 0.61 - 1.12 ng/dL  T3, free     Status: None   Collection Time: 09/24/14  2:21 PM  Result Value Ref Range   T3, Free 2.7 2.0 - 4.4 pg/mL  Urine rapid drug screen (hosp performed)     Status: None   Collection Time: 09/24/14  7:56 PM  Result Value Ref Range   Opiates NONE DETECTED NONE DETECTED   Cocaine NONE DETECTED NONE DETECTED   Benzodiazepines NONE DETECTED NONE DETECTED   Amphetamines NONE DETECTED NONE DETECTED   Tetrahydrocannabinol NONE DETECTED NONE DETECTED   Barbiturates NONE DETECTED NONE DETECTED  Comprehensive metabolic panel     Status: Abnormal   Collection Time: 09/25/14  5:45 AM  Result Value Ref Range   Sodium 136 135 - 145 mmol/L   Potassium 3.7 3.5 - 5.1 mmol/L   Chloride 107 101 - 111 mmol/L   CO2 26 22 - 32 mmol/L   Glucose, Bld 91 65 - 99 mg/dL   BUN <5 (L) 6 - 20 mg/dL   Creatinine, Ser 4.09 0.61 - 1.24 mg/dL   Calcium 8.0 (L) 8.9 - 10.3 mg/dL   Total Protein 5.5 (L) 6.5 - 8.1 g/dL   Albumin 2.4 (L) 3.5 - 5.0 g/dL   AST 8119 (H) 15 - 41 U/L   ALT 1920 (H) 17 - 63 U/L   Alkaline Phosphatase 174 (H) 38 - 126 U/L   Total Bilirubin 9.2 (H) 0.3 - 1.2 mg/dL   GFR calc non Af Amer >60 >60 mL/min   GFR calc Af Amer >60 >60 mL/min   Anion gap 3 (L) 5 - 15  CBC     Status: None   Collection Time: 09/25/14  5:45 AM  Result Value Ref Range   WBC 9.1 4.0 - 10.5 K/uL   RBC 4.43 4.22 - 5.81 MIL/uL   Hemoglobin 13.9 13.0 - 17.0 g/dL   HCT 14.7 82.9 - 56.2 %   MCV 92.3 78.0 - 100.0 fL   MCH 31.4 26.0 - 34.0 pg   MCHC 34.0 30.0 - 36.0 g/dL   RDW 13.0 86.5 - 78.4 %   Platelets 196 150 - 400 K/uL  TSH     Status: None  Collection Time: 09/25/14  5:45 AM  Result Value Ref Range   TSH 0.382 0.350 - 4.500 uIU/mL    Studies/Results: Ct Abdomen Pelvis W Contrast  09/24/2014   CLINICAL DATA:  Subsequent encounter for abdominal pain with elevated LFTs. Abnormal gallbladder ultrasound.  EXAM: CT ABDOMEN AND PELVIS WITH CONTRAST  TECHNIQUE: Multidetector CT imaging of the abdomen and pelvis was performed using the standard protocol following bolus administration of intravenous contrast.  CONTRAST:  100mL OMNIPAQUE IOHEXOL 300 MG/ML  SOLN  COMPARISON:  Ultrasound from 09/23/2014.  FINDINGS: Lower chest: There is some dependent atelectasis in the lower lobes bilaterally with tiny right pleural effusion.  Hepatobiliary: Liver enhancement is heterogeneous with mild periportal edema is evident. Gallbladder wall appears thickened and there is probably some associated pericholecystic fluid. No intrahepatic or extrahepatic biliary dilation.  Pancreas: New no dilatation of the main pancreatic duct. No hypo attenuating or hyper attenuating focal mass lesion.  Spleen: No splenomegaly. No focal mass lesion.  Adrenals/Urinary Tract:  No adrenal nodule or mass. Kidneys are unremarkable. No evidence for hydroureteronephrosis. Bladder has normal imaging features.  Stomach/Bowel: Stomach is nondistended. No gastric wall thickening. No evidence of outlet obstruction. Duodenum is normally positioned as is the ligament of Treitz. No small bowel wall thickening. No small bowel dilatation. Terminal ileum is unremarkable. The appendix is normal. No gross colonic mass. No colonic wall thickening. No substantial diverticular change.  Vascular/Lymphatic: No abdominal aortic aneurysm. The portal vein is patent. Hepatic veins are not yet opacified. No lymphadenopathy in the abdomen. No pelvic sidewall lymphadenopathy.  Reproductive: Prostate gland and seminal vesicles are normal in appearance.  Other: Small to moderate volume ascites is seen around the liver, and Morrison's pouch, the right paracolic gutter, and in the pelvis.  Musculoskeletal: Bone windows reveal no worrisome lytic or sclerotic osseous lesions.  IMPRESSION: Intraperitoneal free fluid is associated with periportal edema and gallbladder wall thickening. Given the lack of gallstones, acute cholecystitis is considered less likely, but nuclear scintigraphy could prove helpful to definitively exclude this process. Hepatitis would also be a consideration. Systemic disease leading to ascites should also be considered.   Electronically Signed   By: Kennith CenterEric  Mansell M.D.   On: 09/24/2014 16:31      Assessment: Acute hepatitis B  Plan:   Continue supportive care. Continue to follow labs. CT scan reviewed. I think the findings described are not related to acute cholecystitis but more likely related to his acute hepatitis. Do not feel at this time that further investigation of gallbladder as needed.    Gwenevere AbbotSAM F Lakitha Gordy 09/25/2014, 9:39 AM  Pager: [phone] If no answer or after 5 PM call [phone]    Component Value Date/Time   WBC 9.1 09/25/2014 0545   HGB 13.9 09/25/2014 0545   HCT  40.9 09/25/2014 0545   PLT 196 09/25/2014 0545   ALT 1920* 09/25/2014 0545   AST 1403* 09/25/2014 0545   NA 136 09/25/2014 0545   K 3.7 09/25/2014 0545   CL 107 09/25/2014 0545   CREATININE 0.98 09/25/2014 0545   BUN <5* 09/25/2014 0545   CO2 26 09/25/2014 0545   CALCIUM 8.0* 09/25/2014 0545   ALKPHOS 174* 09/25/2014 0545

## 2014-09-25 NOTE — Progress Notes (Signed)
Patient Demographics:    Troy Barnett, is a 39 y.o. male, DOB - 1975-04-01, ZOX:[id number]  Admit date - 09/23/2014   Admitting Physician Ripudeep Jenna Luo, MD  Outpatient Primary MD for the patient is No PCP Per Patient  LOS - 2   Chief Complaint  Patient presents with  . Abdominal Pain        Subjective:    Levell July today has, No headache, No chest pain, No abdominal pain - No Nausea, No new weakness tingling or numbness, No Cough - SOB.     Assessment  & Plan :    1. Abdominal pain with evidence of acute hepatitis B infection and elevated liver enzymes, nonspecific fluid collection under the gallbladder fossa. GI on board, overall patient much better, diet advanced by GI. CT abdomen and pelvis ordered and noted with no gallstones and some nonspecific fluid collection around the gallbladder and liver most likely due to hepatitis, discussed with Dr. Evette Cristal GI - continue supportive care. Hep C negative, HIV urology negative. Patient denies any risk factors for hep B.   2. Abdominal pain with nausea and vomiting. Due to #1 above. Hydrate with IV fluids and supportive care. Pain has resolved.   3. Mildly low TSH on admission. Repeat TSH stable.     Code Status : Full  Family Communication  : None  Disposition Plan  : Home in 2- 3 days  Consults  :  GI  Procedures  :   Right upper quadrant ultrasound showing some fluid collection in the gallbladder of unclear significance  CT abdomen pelvis did with some fluid under the gallbladder and around the liver most likely secondary to hepatitis.  DVT Prophylaxis  :   Heparin   Lab Results  Component Value Date   PLT 196 09/25/2014    Inpatient Medications  Scheduled Meds: . heparin  5,000 Units Subcutaneous 3 times per day  . nicotine  21  mg Transdermal Daily   Continuous Infusions: . sodium chloride     PRN Meds:.alum & mag hydroxide-simeth, HYDROmorphone (DILAUDID) injection, [DISCONTINUED] ondansetron **OR** ondansetron (ZOFRAN) IV  Antibiotics  :    Anti-infectives    None        Objective:   Filed Vitals:   09/24/14 0907 09/24/14 1737 09/24/14 2102 09/25/14 0502  BP: 126/80 121/70 127/76 138/85  Pulse: 58 73 59 52  Temp: 98.3 F (36.8 C) 98.7 F (37.1 C) 98.3 F (36.8 C) 98.3 F (36.8 C)  TempSrc: Oral Oral Oral Oral  Resp: Height:      Weight:   69.31 kg (152 lb 12.8 oz)   SpO2: 98% 98% 100% 98%    Wt Readings from Last 3 Encounters:  09/24/14 69.31 kg (152 lb 12.8 oz)  08/24/12 68.947 kg (152 lb)     Intake/Output Summary (Last 24 hours) at 09/25/14 0948 Last data filed at 09/25/14 0600  Gross per 24 hour  Intake   1990 ml  Output    225 ml  Net   1765 ml     Physical Exam  Awake Alert, Oriented X 3, No new F.N deficits, Normal affect [place].AT,PERRAL Supple Neck,No JVD, No cervical lymphadenopathy appriciated.  Symmetrical Chest wall movement,  Good air movement bilaterally, CTAB RRR,No Gallops,Rubs or new Murmurs, No Parasternal Heave +ve B.Sounds, Abd Soft, No tenderness, No organomegaly appriciated, No rebound - guarding or rigidity. No Cyanosis, Clubbing or edema, No new Rash or bruise      Data Review:   Micro Results No results found for this or any previous visit (from the past 240 hour(s)).  Radiology Reports US Abdomen Complete  09/23/2014   CLINICAL DATA:  Elevated liver function studies, 3 days of abdominal pain.  EXAM: ULTRASOUND ABDOMEN COMPLETE  COMPARISON:  None.  FINDINGS: Gallbladder: The gallbladder is contracted. Measurement of the gallbladder wall is difficult. There are no discrete stones. There is a positive sonographic Murphy's sign.  Common bile duct: Diameter: 2.4 mm  Liver: The hepatic echotexture is normal. There is no focal mass or ductal  dilation. Inferior to the liver and gallbladder fossa there is fluid-like echogenic material. The patient is tender in this region.  IVC: No abnormality visualized.  Pancreas: Visualized portion unremarkable.  Spleen: Size and appearance within normal limits.  Right Kidney: Length: 10 cm. Echogenicity within normal limits. No mass or hydronephrosis visualized.  Left Kidney: Length: 10 cm. Echogenicity within normal limits. No mass or hydronephrosis visualized.  Abdominal aorta: No aneurysm visualized.  Other findings: Other than the echogenic fluid collection inferior to the liver no free fluid is observed.  IMPRESSION: 1. Contracted gallbladder with positive sonographic Murphy's sign suspicious for acute cholecystitis. No stones are evident. 2. Abnormal fluid collection containing internal echoes inferior to the liver and gallbladder fossa but possibly related to the gallbladder. This could reflect an abscess. Abdominal and pelvic CT scanning now is recommended to further evaluate this finding.   Electronically Signed   By: David  [place] M.D.   On: 09/23/2014 12:05   Ct Abdomen Pelvis W Contrast  09/24/2014   CLINICAL DATA:  Subsequent encounter for abdominal pain with elevated LFTs. Abnormal gallbladder ultrasound.  EXAM: CT ABDOMEN AND PELVIS WITH CONTRAST  TECHNIQUE: Multidetector CT imaging of the abdomen and pelvis was performed using the standard protocol following bolus administration of intravenous contrast.  CONTRAST:  OMNIPAQUE IOHEXOL 300 MG/ML  SOLN  COMPARISON:  Ultrasound from 09/23/2014.  FINDINGS: Lower chest: There is some dependent atelectasis in the lower lobes bilaterally with tiny right pleural effusion.  Hepatobiliary: Liver enhancement is heterogeneous with mild periportal edema is evident. Gallbladder wall appears thickened and there is probably some associated pericholecystic fluid. No intrahepatic or extrahepatic biliary dilation.  Pancreas: New no dilatation of the main  pancreatic duct. No hypo attenuating or hyper attenuating focal mass lesion.  Spleen: No splenomegaly. No focal mass lesion.  Adrenals/Urinary Tract: No adrenal nodule or mass. Kidneys are unremarkable. No evidence for hydroureteronephrosis. Bladder has normal imaging features.  Stomach/Bowel: Stomach is nondistended. No gastric wall thickening. No evidence of outlet obstruction. Duodenum is normally positioned as is the ligament of Treitz. No small bowel wall thickening. No small bowel dilatation. Terminal ileum is unremarkable. The appendix is normal. No gross colonic mass. No colonic wall thickening. No substantial diverticular change.  Vascular/Lymphatic: No abdominal aortic aneurysm. The portal vein is patent. Hepatic veins are not yet opacified. No lymphadenopathy in the abdomen. No pelvic sidewall lymphadenopathy.  Reproductive: Prostate gland and seminal vesicles are normal in appearance.  Other: Small to moderate volume ascites is seen around the liver, and Morrison's pouch, the right paracolic gutter, and in the pelvis.  Musculoskeletal: Bone windows reveal no worrisome lytic or sclerotic osseous lesions.  IMPRESSION: Intraperitoneal free fluid is associated with periportal edema and gallbladder wall thickening. Given the lack of gallstones, acute cholecystitis is considered less likely, but nuclear scintigraphy could prove helpful to definitively exclude this process. Hepatitis would also be a consideration. Systemic disease leading to ascites should also be considered.   Electronically Signed   By: Kennith CenterEric  Mansell M.D.   On: 09/24/2014 16:31     CBC  Recent Labs Lab 09/23/14 0750 09/23/14 1335 09/24/14 0545 09/25/14 0545  WBC 11.8* 9.8 8.6 9.1  HGB 15.9 14.7 13.8 13.9  HCT 46.3 42.8 41.1 40.9  PLT 187 189 185 196  MCV 92.0 91.8 91.5 92.3  MCH 31.6 31.5 30.7 31.4  MCHC 34.3 34.3 33.6 34.0  RDW 13.7 13.8 14.0 14.2  LYMPHSABS 4.5*  --   --   --   MONOABS 2.5*  --   --   --   EOSABS 0.1   --   --   --   BASOSABS 0.2*  --   --   --     Chemistries   Recent Labs Lab 09/23/14 0750 09/23/14 1335 09/24/14 0545 09/25/14 0545  NA 135  --  136 136  K 3.6  --  3.6 3.7  CL 103  --  105 107  CO2 25  --  26 26  GLUCOSE 96  --  86 91  BUN 7  --  <5* <5*  CREATININE 1.14 1.14 1.00 0.98  CALCIUM 8.2*  --  7.8* 8.0*  AST 2053*  --  1534* 1403*  ALT 2722*  --  2161* 1920*  ALKPHOS 247*  --  186* 174*  BILITOT 7.9*  --  7.5* 9.2*   ------------------------------------------------------------------------------------------------------------------ estimated creatinine clearance is 98.9 mL/min (by C-G formula based on Cr of 0.98). ------------------------------------------------------------------------------------------------------------------  Recent Labs  09/23/14 1335  HGBA1C 5.9*   ------------------------------------------------------------------------------------------------------------------ No results for input(s): CHOL, HDL, LDLCALC, TRIG, CHOLHDL, LDLDIRECT in the last 72 hours. ------------------------------------------------------------------------------------------------------------------  Recent Labs  09/24/14 1421 09/25/14 0545  TSH  --  0.382  T3FREE 2.7  --    ------------------------------------------------------------------------------------------------------------------ No results for input(s): VITAMINB12, FOLATE, FERRITIN, TIBC, IRON, RETICCTPCT in the last 72 hours.  Coagulation profile  Recent Labs Lab 09/23/14 1335  INR 1.25   Lipase     Component Value Date/Time   LIPASE 35 09/23/2014 0750      No results for input(s): DDIMER in the last 72 hours.  Cardiac Enzymes No results for input(s): CKMB, TROPONINI, MYOGLOBIN in the last 168 hours.  Invalid input(s): CK ------------------------------------------------------------------------------------------------------------------ Invalid input(s): POCBNP   Time Spent in minutes    35   SINGH,PRASHANT K M.D on 09/25/2014 at 9:48 AM  Between 7am to 7pm - Pager - [phone]  After 7pm go to [email or web address] - password [email or web address]  [facility]   Office  [phone]    ]

## 2014-09-26 LAB — COMPREHENSIVE METABOLIC PANEL
ALBUMIN: 2.3 g/dL — AB (ref 3.5–5.0)
ALT: 2070 U/L — ABNORMAL HIGH (ref 17–63)
AST: 1846 U/L — ABNORMAL HIGH (ref 15–41)
Alkaline Phosphatase: 168 U/L — ABNORMAL HIGH (ref 38–126)
Anion gap: 6 (ref 5–15)
BILIRUBIN TOTAL: 10.6 mg/dL — AB (ref 0.3–1.2)
CO2: 26 mmol/L (ref 22–32)
Calcium: 8.2 mg/dL — ABNORMAL LOW (ref 8.9–10.3)
Chloride: 106 mmol/L (ref 101–111)
Creatinine, Ser: 1.05 mg/dL (ref 0.61–1.24)
GFR calc Af Amer: >60 mL/min (ref >60)
GFR calc non Af Amer: >60 mL/min (ref >60)
Glucose, Bld: 82 mg/dL (ref 65–99)
Potassium: 3.7 mmol/L (ref 3.5–5.1)
SODIUM: 138 mmol/L (ref 135–145)
Total Protein: 5.4 g/dL — ABNORMAL LOW (ref 6.5–8.1)

## 2014-09-26 NOTE — Discharge Instructions (Signed)
Follow with Primary MD in 7 days , use condoms during sex, inform your present and past sexual partners that you have hepatitis B.  Do not use any over-the-counter Tylenol or acetaminophen containing products.  Get CBC, CMP, 2 view Chest X [place] checked  by Primary MD next visit.    Activity: As tolerated with Full fall precautions use walker/cane & assistance as needed   Disposition Home     Diet: Heart Healthy    For Heart failure patients - Check your Weight same time everyday, if you gain over 2 pounds, or you develop in leg swelling, experience more shortness of breath or chest pain, call your Primary MD immediately. Follow Cardiac Low Salt Diet and 1.5 lit/day fluid restriction.   On your next visit with your primary care physician please Get Medicines reviewed and adjusted.   Please request your Prim.MD to go over all Hospital Tests and Procedure/Radiological results at the follow up, please get all Hospital records sent to your Prim MD by signing hospital release before you go home.   If you experience worsening of your admission symptoms, develop shortness of breath, life threatening emergency, suicidal or homicidal thoughts you must seek medical attention immediately by calling 911 or calling your MD immediately  if symptoms less severe.  You Must read complete instructions/literature along with all the possible adverse reactions/side effects for all the Medicines you take and that have been prescribed to you. Take any new Medicines after you have completely understood and accpet all the possible adverse reactions/side effects.   Do not drive, operating heavy machinery, perform activities at heights, swimming or participation in water activities or provide baby sitting services if your were admitted for syncope or siezures until you have seen by Primary MD or a Neurologist and advised to do so again.  Do not drive when taking Pain medications.    Do not take more than  prescribed Pain, Sleep and Anxiety Medications  Special Instructions: If you have smoked or chewed Tobacco  in the last 2 yrs please stop smoking, stop any regular Alcohol  and or any Recreational drug use.  Wear Seat belts while driving.   Please note  You were cared for by a hospitalist during your hospital stay. If you have any questions about your discharge medications or the care you received while you were in the hospital after you are discharged, you can call the unit and asked to speak with the hospitalist on call if the hospitalist that took care of you is not available. Once you are discharged, your primary care physician will handle any further medical issues. Please note that NO REFILLS for any discharge medications will be authorized once you are discharged, as it is imperative that you return to your primary care physician (or establish a relationship with a primary care physician if you do not have one) for your aftercare needs so that they can reassess your need for medications and monitor your lab values.

## 2014-09-26 NOTE — Progress Notes (Signed)
Patient discharged to home. PIV removed. Discharge instructions reviewed with patient. Handout on hepatitis B also given to patient. Answered all questions.  Scarlette Hogston, BSN, RN-BC.

## 2014-09-26 NOTE — Discharge Summary (Signed)
Troy Barnett, is a 39 y.o. male  DOB March 09, 1976  MRN [id number].  Admission date:  09/23/2014  Admitting Physician  Ripudeep Jenna Luo, MD  Discharge Date:  09/26/2014   Primary MD  No PCP Per Patient  Recommendations for primary care physician for things to follow:   Data CBC, CMP and INR closely. Needs outpatient follow-up with ID and GI for acute hepatitis B.   Admission Diagnosis  Hepatitis [K75.9] Non-intractable vomiting with nausea, vomiting of unspecified type [R11.2]   Discharge Diagnosis  Hepatitis [K75.9] Non-intractable vomiting with nausea, vomiting of unspecified type [R11.2]    Principal Problem:   Acute hepatitis Active Problems:   Transaminitis   Nausea and vomiting      History reviewed. No pertinent past medical history.  History reviewed. No pertinent past surgical history.     HPI  from the history and physical done on the day of admission:    Patient is a 39 year old male with no past medical history presented to ED with epigastric pain, nausea and vomiting for last 3 days. History was obtained from the patient who reported that he had barbecue cook out at his work on Friday, he had a couple of burgers and pork. He had no alcohol, has quit drinking one month ago. Next morning, he started having intractable nausea and vomiting unable to hold anything down since then. He denied any fevers or chills. He also reported periumbilical abdominal pain,6/10, intermittent, worse on movement. Otherwise patient denies any recent travels. He is sexually active but uses protection. No prior history of hepatitis. Patient reported that he noticed dark colored urine in the last 2 days. He denies any diarrhea. EDP note reviewed, labs showed lipase 35, AST 2053, ALT 2722, total bilirubin 7.9      Hospital  Course:     1. Abdominal pain with evidence of acute hepatitis B infection and elevated liver enzymes, nonspecific fluid collection under the gallbladder fossa. GI on board, overall patient much better, diet advanced by GI. CT abdomen and pelvis ordered and noted with no gallstones and some nonspecific fluid collection around the gallbladder and liver most likely due to hepatitis, discussed with Dr. Evette Cristal GI - continue supportive care. Hep C negative, HIV urology negative. Patient denies any risk factors for hep B.  Patient is symptom-free, discussed with Dr. Evette Cristal this morning, stable for discharge with outpatient follow-up with GI and ID. Patient has been requested to inform all past and present sexual partners that he is hepatitis B positive and to get them tested as well. Has been advised to use condoms during sexual activity. TOLD not to use any acetaminophen products.   2. Abdominal pain with nausea and vomiting. Due to #1 above. Hydrated with IV fluids and supportive care. Pain has resolved.   3. Mildly low TSH on admission. Repeat TSH stable.   4. Stress smoking and alcohol use counseled to quit.   Discharge Condition: Stable  Follow UP  Follow-up Information    Follow up with [facility]    . Schedule an appointment as soon as possible for a visit in 1 week.   Contact information:   [address] [place] [address] [phone]      Follow up with Judyann Munson, MD. Schedule an appointment as soon as possible for a visit in 1 week.   Specialty:  Infectious Diseases   Why:  Hepatitis B new diagnosis   [address] [place] [address] [phone]       Follow up with Gwenevere Abbot, MD. Schedule an appointment as soon as possible for a visit in 1 week.   Specialty:  Gastroenterology   Why:  Hepatitis B   Contact information:   [address] [place]  [address] [phone]        Consults obtained - GI [place]  Diet and Activity recommendation: See Discharge Instructions below  Discharge Instructions       Discharge Instructions    Diet - low sodium heart healthy    Complete by:  As directed      Discharge instructions    Complete by:  As directed   Follow with Primary MD in 7 days , use condoms during sex, inform your present and past sexual partners that you have hepatitis B.  Do not use any over-the-counter Tylenol or acetaminophen containing products.  Get CBC, CMP, 2 view Chest X [place] checked  by Primary MD next visit.    Activity: As tolerated with Full fall precautions use walker/cane & assistance as needed   Disposition Home     Diet: Heart Healthy    For Heart failure patients - Check your Weight same time everyday, if you gain over 2 pounds, or you develop in leg swelling, experience more shortness of breath or chest pain, call your Primary MD immediately. Follow Cardiac Low Salt Diet and 1.5 lit/day fluid restriction.   On your next visit with your primary care physician please Get Medicines reviewed and adjusted.   Please request your Prim.MD to go over all Hospital Tests and Procedure/Radiological results at the follow up, please get all Hospital records sent to your Prim MD by signing hospital release before you go home.   If you experience worsening of your admission symptoms, develop shortness of breath, life threatening emergency, suicidal or homicidal thoughts you must seek medical attention immediately by calling 911 or calling your MD immediately  if symptoms less severe.  You Must read complete instructions/literature along with all the possible adverse reactions/side effects for all the Medicines you take and that have been prescribed to you. Take any new Medicines after you have completely understood and accpet all the possible adverse reactions/side effects.   Do not drive, operating heavy machinery,  perform activities at heights, swimming or participation in water activities or provide baby sitting services if your were admitted for syncope or siezures until you have seen by Primary MD or a Neurologist and advised to do so again.  Do not drive when taking Pain medications.    Do not take more than prescribed Pain, Sleep and Anxiety Medications  Special Instructions: If you have smoked or chewed Tobacco  in the last 2 yrs please stop smoking, stop any regular Alcohol  and or any Recreational drug use.  Wear Seat belts while driving.   Please note  You were cared for by a hospitalist during your hospital stay. If you  have any questions about your discharge medications or the care you received while you were in the hospital after you are discharged, you can call the unit and asked to speak with the hospitalist on call if the hospitalist that took care of you is not available. Once you are discharged, your primary care physician will handle any further medical issues. Please note that NO REFILLS for any discharge medications will be authorized once you are discharged, as it is imperative that you return to your primary care physician (or establish a relationship with a primary care physician if you do not have one) for your aftercare needs so that they can reassess your need for medications and monitor your lab values.     Increase activity slowly    Complete by:  As directed              Discharge Medications       Medication List    Notice    You have not been prescribed any medications.      Major procedures and Radiology Reports - PLEASE review detailed and final reports for all details, in brief -       [place] Abdomen Complete  09/23/2014   CLINICAL DATA:  Elevated liver function studies, 3 days of abdominal pain.  EXAM: ULTRASOUND ABDOMEN COMPLETE  COMPARISON:  None.  FINDINGS: Gallbladder: The gallbladder is contracted. Measurement of the gallbladder wall is difficult.  There are no discrete stones. There is a positive sonographic Murphy's sign.  Common bile duct: Diameter: 2.4 mm  Liver: The hepatic echotexture is normal. There is no focal mass or ductal dilation. Inferior to the liver and gallbladder fossa there is fluid-like echogenic material. The patient is tender in this region.  IVC: No abnormality visualized.  Pancreas: Visualized portion unremarkable.  Spleen: Size and appearance within normal limits.  Right Kidney: Length: 10 cm. Echogenicity within normal limits. No mass or hydronephrosis visualized.  Left Kidney: Length: 10 cm. Echogenicity within normal limits. No mass or hydronephrosis visualized.  Abdominal aorta: No aneurysm visualized.  Other findings: Other than the echogenic fluid collection inferior to the liver no free fluid is observed.  IMPRESSION: 1. Contracted gallbladder with positive sonographic Murphy's sign suspicious for acute cholecystitis. No stones are evident. 2. Abnormal fluid collection containing internal echoes inferior to the liver and gallbladder fossa but possibly related to the gallbladder. This could reflect an abscess. Abdominal and pelvic CT scanning now is recommended to further evaluate this finding.   Electronically Signed   By: David  SwazilandJordan M.D.   On: 09/23/2014 12:05   Ct Abdomen Pelvis W Contrast  09/24/2014   CLINICAL DATA:  Subsequent encounter for abdominal pain with elevated LFTs. Abnormal gallbladder ultrasound.  EXAM: CT ABDOMEN AND PELVIS WITH CONTRAST  TECHNIQUE: Multidetector CT imaging of the abdomen and pelvis was performed using the standard protocol following bolus administration of intravenous contrast.  CONTRAST:  100mL OMNIPAQUE IOHEXOL 300 MG/ML  SOLN  COMPARISON:  Ultrasound from 09/23/2014.  FINDINGS: Lower chest: There is some dependent atelectasis in the lower lobes bilaterally with tiny right pleural effusion.  Hepatobiliary: Liver enhancement is heterogeneous with mild periportal edema is evident.  Gallbladder wall appears thickened and there is probably some associated pericholecystic fluid. No intrahepatic or extrahepatic biliary dilation.  Pancreas: New no dilatation of the main pancreatic duct. No hypo attenuating or hyper attenuating focal mass lesion.  Spleen: No splenomegaly. No focal mass lesion.  Adrenals/Urinary Tract: No adrenal nodule or mass. Kidneys  are unremarkable. No evidence for hydroureteronephrosis. Bladder has normal imaging features.  Stomach/Bowel: Stomach is nondistended. No gastric wall thickening. No evidence of outlet obstruction. Duodenum is normally positioned as is the ligament of Treitz. No small bowel wall thickening. No small bowel dilatation. Terminal ileum is unremarkable. The appendix is normal. No gross colonic mass. No colonic wall thickening. No substantial diverticular change.  Vascular/Lymphatic: No abdominal aortic aneurysm. The portal vein is patent. Hepatic veins are not yet opacified. No lymphadenopathy in the abdomen. No pelvic sidewall lymphadenopathy.  Reproductive: Prostate gland and seminal vesicles are normal in appearance.  Other: Small to moderate volume ascites is seen around the liver, and Morrison's pouch, the right paracolic gutter, and in the pelvis.  Musculoskeletal: Bone windows reveal no worrisome lytic or sclerotic osseous lesions.  IMPRESSION: Intraperitoneal free fluid is associated with periportal edema and gallbladder wall thickening. Given the lack of gallstones, acute cholecystitis is considered less likely, but nuclear scintigraphy could prove helpful to definitively exclude this process. Hepatitis would also be a consideration. Systemic disease leading to ascites should also be considered.   Electronically Signed   By: Kennith Center M.D.   On: 09/24/2014 16:31    Micro Results      Recent Results (from the past 240 hour(s))  Urine culture     Status: None   Collection Time: 09/24/14  1:23 AM  Result Value Ref Range Status    Specimen Description URINE, CLEAN CATCH  Final   Special Requests NONE  Final   Culture NO GROWTH 1 DAY  Final   Report Status 09/25/2014 FINAL  Final       Today   Subjective    Levell July today has no headache,no chest abdominal pain,no new weakness tingling or numbness, feels much better wants to go home today.    Objective   Blood pressure 147/83, pulse 46, temperature 98.2 F (36.8 C), temperature source Oral, resp. rate 18, height 5\' 8"  (1.727 m), weight 69.31 kg (152 lb 12.8 oz), SpO2 99 %.   Intake/Output Summary (Last 24 hours) at 09/26/14 0943 Last data filed at 09/26/14 0900  Gross per 24 hour  Intake 1969.16 ml  Output    250 ml  Net 1719.16 ml    Exam Awake Alert, Oriented x 3, No new F.N deficits, Normal affect [place].AT,PERRAL Supple Neck,No JVD, No cervical lymphadenopathy appriciated.  Symmetrical Chest wall movement, Good air movement bilaterally, CTAB RRR,No Gallops,Rubs or new Murmurs, No Parasternal Heave +ve B.Sounds, Abd Soft, Non tender, No organomegaly appriciated, No rebound -guarding or rigidity. No Cyanosis, Clubbing or edema, No new Rash or bruise   Data Review   CBC w Diff: Lab Results  Component Value Date   WBC 9.1 09/25/2014   HGB 13.9 09/25/2014   HCT 40.9 09/25/2014   PLT 196 09/25/2014   LYMPHOPCT 38 09/23/2014   MONOPCT 21* 09/23/2014   EOSPCT 1 09/23/2014   BASOPCT 2* 09/23/2014    CMP: Lab Results  Component Value Date   NA 138 09/26/2014   K 3.7 09/26/2014   CL 106 09/26/2014   CO2 26 09/26/2014   BUN <5* 09/26/2014   CREATININE 1.05 09/26/2014   PROT 5.4* 09/26/2014   ALBUMIN 2.3* 09/26/2014   BILITOT 10.6* 09/26/2014   ALKPHOS 168* 09/26/2014   AST 1846* 09/26/2014   ALT 2070* 09/26/2014  .   Total Time in preparing paper work, data evaluation and todays exam - 35 minutes  Susa Raring K M.D on 09/26/2014 at  9:43 AM  [facility]   Office  [phone]

## 2014-09-26 NOTE — Progress Notes (Signed)
[facility] Progress Note  Subjective: The patient states that he feels fine. He would like to go home. He is tolerating his diet. He denies nausea vomiting or abdominal pain.  Objective: Vital signs in last 24 hours: Temp:  [98.1 F (36.7 C)-98.5 F (36.9 C)] 98.2 F (36.8 C) (07/01 0739) Pulse Rate:  [46-65] 46 (07/01 0739) Resp:  [18] 18 (07/01 0739) BP: (118-147)/(79-83) 147/83 mmHg (07/01 0739) SpO2:  [98 %-99 %] 99 % (07/01 0739) Weight change:    PE:  No distress, scleral icterus  Heart regular rhythm no murmurs  Lungs clear  Abdomen: Soft and nontender  Lab Results: Results for orders placed or performed during the hospital encounter of 09/23/14 (from the past 24 hour(s))  Protime-INR     Status: Abnormal   Collection Time: 09/25/14 12:07 PM  Result Value Ref Range   Prothrombin Time 16.0 (H) 11.6 - 15.2 seconds   INR 1.27 0.00 - 1.49  Comprehensive metabolic panel     Status: Abnormal   Collection Time: 09/26/14  5:29 AM  Result Value Ref Range   Sodium 138 135 - 145 mmol/L   Potassium 3.7 3.5 - 5.1 mmol/L   Chloride 106 101 - 111 mmol/L   CO2 26 22 - 32 mmol/L   Glucose, Bld 82 65 - 99 mg/dL   BUN <5 (L) 6 - 20 mg/dL   Creatinine, Ser 4.091.05 0.61 - 1.24 mg/dL   Calcium 8.2 (L) 8.9 - 10.3 mg/dL   Total Protein 5.4 (L) 6.5 - 8.1 g/dL   Albumin 2.3 (L) 3.5 - 5.0 g/dL   AST 81191846 (H) 15 - 41 U/L   ALT 2070 (H) 17 - 63 U/L   Alkaline Phosphatase 168 (H) 38 - 126 U/L   Total Bilirubin 10.6 (H) 0.3 - 1.2 mg/dL   GFR calc non Af Amer >60 >60 mL/min   GFR calc Af Amer >60 >60 mL/min   Anion gap 6 5 - 15    Studies/Results: No results found.    Assessment: Acute hepatitis B. There has been some elevation of transaminases from yesterday to today but he feels fine. His INR is normal.  Plan:   I think he can go home from a clinical standpoint and follow-up as an outpatient in a couple of weeks. Labs can be rechecked and clinical status can be  reviewed. Should he have worsening symptoms he is told to come back to the ER.    Gwenevere AbbotSAM F Montia Haslip 09/26/2014, 8:52 AM  Pager: [phone] If no answer or after 5 PM call [phone]

## 2015-07-02 ENCOUNTER — Emergency Department (HOSPITAL_COMMUNITY)
Admission: EM | Admit: 2015-07-02 | Discharge: 2015-07-03 | Disposition: A | Discharge status: Home / Self Care | Payer: Self-pay | Attending: Emergency Medicine | Admitting: Emergency Medicine

## 2015-07-02 ENCOUNTER — Encounter (HOSPITAL_COMMUNITY): Payer: Self-pay | Admitting: *Deleted

## 2015-07-02 DIAGNOSIS — H538 Other visual disturbances: Secondary | ICD-10-CM | POA: Insufficient documentation

## 2015-07-02 DIAGNOSIS — J0110 Acute frontal sinusitis, unspecified: Secondary | ICD-10-CM | POA: Insufficient documentation

## 2015-07-02 DIAGNOSIS — R030 Elevated blood-pressure reading, without diagnosis of hypertension: Secondary | ICD-10-CM | POA: Insufficient documentation

## 2015-07-02 DIAGNOSIS — R040 Epistaxis: Secondary | ICD-10-CM | POA: Insufficient documentation

## 2015-07-02 DIAGNOSIS — F17210 Nicotine dependence, cigarettes, uncomplicated: Secondary | ICD-10-CM | POA: Insufficient documentation

## 2015-07-02 DIAGNOSIS — IMO0001 Reserved for inherently not codable concepts without codable children: Secondary | ICD-10-CM

## 2015-07-02 NOTE — ED Notes (Signed)
The pt is c/o a headache for 2 weeks he is very anxious.  He is hyperventilating he reports that his eyes look weird.  He has sinujs congestion  He doeds not think it is a co,ld  Sniffing almost constantly

## 2015-07-03 MED ORDER — LORATADINE 10 MG PO TABS: 10.0000 mg | ORAL_TABLET | Freq: Every day | ORAL | Status: DC

## 2015-07-03 MED ORDER — AMOXICILLIN 500 MG PO CAPS
1000.0000 mg | ORAL_CAPSULE | Freq: Once | ORAL | Status: AC
  Administered 2015-07-03: 1000 mg via ORAL
  Filled 2015-07-03: qty 2

## 2015-07-03 MED ORDER — TRIAMCINOLONE ACETONIDE 55 MCG/ACT NA AERO: 2.0000 | INHALATION_SPRAY | Freq: Every day | NASAL | Status: DC

## 2015-07-03 MED ORDER — AMOXICILLIN 500 MG PO CAPS: 1000.0000 mg | ORAL_CAPSULE | Freq: Two times a day (BID) | ORAL | Status: DC

## 2015-07-03 NOTE — Discharge Instructions (Signed)

## 2015-07-03 NOTE — ED Provider Notes (Signed)
CSN: [id number]     Arrival date & time 07/02/15  2211 History   First MD Initiated Contact with Patient 07/02/15 2359     Chief Complaint  Patient presents with  . Migraine     (Consider location/radiation/quality/duration/timing/severity/associated sxs/prior Treatment) HPI Comments: Patient with no significant medical history presents with complaint of intermittent headache for the past 2 weeks. He describes the HA as throbbing, located in frontal and temporal regions, worse with bright light and certain positions. He reports recent brief nosebleed that occurred earlier today. He has a history of seasonal allergies and reports recent nasal drainage that is persistent. No fever, nausea, vomiting, chest pain, SOB, wheezing or cough.   The history is provided by the patient and the spouse. No language interpreter was used.    History reviewed. No pertinent past medical history. History reviewed. No pertinent past surgical history. No family history on file. Social History  Substance Use Topics  . Smoking status: Current Every Day Smoker -- 0.50 packs/day    Types: Cigarettes  . Smokeless tobacco: None  . Alcohol Use: No     Comment: beer/day    Review of Systems  Constitutional: Negative for fever and chills.  HENT: Positive for congestion, rhinorrhea and sinus pressure. Negative for sore throat and trouble swallowing.   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 Negative for discharge.  Respiratory: Negative.  Negative for cough, shortness of breath and wheezing.   Cardiovascular: Negative.  Negative for chest pain.  Gastrointestinal: Negative.  Negative for nausea and abdominal pain.  Musculoskeletal: Negative.  Negative for neck pain and neck stiffness.  Skin: Negative.  Negative for rash.  Neurological: Positive for headaches. Negative for syncope and weakness.      Allergies  Review of patient's allergies  indicates no known allergies.  Home Medications   Prior to Admission medications   Not on File   BP 137/97 mmHg  Pulse 71  Temp(Src) 98.8 F (37.1 C)  Resp 16  Ht  (1.753 m)  Wt 69.882 kg  BMI 22.74 kg/m2  SpO2 100% Physical Exam  Constitutional: He is oriented to person, place, and time. He appears well-developed and well-nourished.  HENT:  Head: Normocephalic and atraumatic.  Right Ear: Tympanic membrane normal.  Left Ear: Tympanic membrane normal.  Nose: Mucosal edema (significant bilateral nasal mucosal edema and redness) present. No epistaxis. Right sinus exhibits frontal sinus tenderness. Left sinus exhibits frontal sinus tenderness.  Mouth/Throat: Uvula is midline and oropharynx is clear and moist. Mucous membranes are not dry.  Eyes: EOM are normal. Pupils are equal, round, and reactive to light.  Neck: Normal range of motion.  Cardiovascular: Normal rate and regular rhythm.   No murmur heard. Pulmonary/Chest: Effort normal and breath sounds normal. He has no wheezes. He has no rales.  Abdominal: Soft. There is no tenderness.  Musculoskeletal: Normal range of motion.  Neurological: He is alert and oriented to person, place, and time. He has normal strength and normal reflexes. No sensory deficit. He displays a negative Romberg sign. Coordination normal.  CN's 3-12 intact. Ambulatory with normal gait and no imbalance. Coordination without deficits. Speech clear and focused.   Skin: Skin is warm and dry.    ED Course  Procedures (including critical care time) Labs Review Labs Reviewed - No data to display  Imaging Review No results found. I have personally reviewed and evaluated these images and lab results as part of  my medical decision-making.   EKG Interpretation None      MDM   Final diagnoses:  None    1. Sinusitis  Patient with a headache c/w sinus pressure headache - is reproducible. He has symptoms and exam findings c/w sinusitis. No fever.  Duration of symptoms is 2 weeks. Will start on abx, nasal spray, recommend Claritin or Zyrtec daily. Recommend PCP follow up for recheck of blood pressure and current symptoms of sinusitis.    Elpidio AnisShari Arrielle Mcginn, PA-C 07/03/15 [id number]  Nelva Nayobert Beaton, MD 07/03/15 [phone]

## 2015-12-28 ENCOUNTER — Emergency Department (HOSPITAL_COMMUNITY)
Admission: EM | Admit: 2015-12-28 | Discharge: 2015-12-28 | Disposition: A | Discharge status: Home / Self Care | Payer: Self-pay | Attending: Emergency Medicine | Admitting: Emergency Medicine

## 2015-12-28 ENCOUNTER — Encounter (HOSPITAL_COMMUNITY): Payer: Self-pay | Admitting: Nurse Practitioner

## 2015-12-28 DIAGNOSIS — H1789 Other corneal scars and opacities: Secondary | ICD-10-CM | POA: Insufficient documentation

## 2015-12-28 DIAGNOSIS — H578 Other specified disorders of eye and adnexa: Secondary | ICD-10-CM | POA: Insufficient documentation

## 2015-12-28 DIAGNOSIS — H5789 Other specified disorders of eye and adnexa: Secondary | ICD-10-CM

## 2015-12-28 DIAGNOSIS — H1031 Unspecified acute conjunctivitis, right eye: Secondary | ICD-10-CM

## 2015-12-28 DIAGNOSIS — H179 Unspecified corneal scar and opacity: Secondary | ICD-10-CM

## 2015-12-28 DIAGNOSIS — H109 Unspecified conjunctivitis: Secondary | ICD-10-CM | POA: Insufficient documentation

## 2015-12-28 DIAGNOSIS — F17210 Nicotine dependence, cigarettes, uncomplicated: Secondary | ICD-10-CM | POA: Insufficient documentation

## 2015-12-28 MED ORDER — FLUORESCEIN-BENOXINATE 0.25-0.4 % OP SOLN: 1.0000 [drp] | Freq: Once | OPHTHALMIC | Status: DC

## 2015-12-28 MED ORDER — TETRACAINE HCL 0.5 % OP SOLN
1.0000 [drp] | Freq: Once | OPHTHALMIC | Status: AC
  Administered 2015-12-28: 1 [drp] via OPHTHALMIC
  Filled 2015-12-28: qty 2

## 2015-12-28 MED ORDER — BACITRACIN-POLYMYXIN B 500-10000 UNIT/GM OP OINT
1.0000 | TOPICAL_OINTMENT | Freq: Four times a day (QID) | OPHTHALMIC | 0 refills | Status: AC
Start: 2015-12-28 — End: 2016-01-04

## 2015-12-28 MED ORDER — FLUORESCEIN SODIUM 1 MG OP STRP
2.0000 | ORAL_STRIP | Freq: Once | OPHTHALMIC | Status: AC
  Administered 2015-12-28: 2 via OPHTHALMIC
  Filled 2015-12-28: qty 2

## 2015-12-28 NOTE — ED Notes (Signed)
Declined W/C at D/C and was escorted to lobby by RN. 

## 2015-12-28 NOTE — ED Provider Notes (Signed)
[facility] Provider Note   CSN: [id number] Arrival date & time: 12/28/15  1532     History   Chief Complaint Chief Complaint  Patient presents with  . Eye Problem    HPI Troy Barnett is a 40 y.o. male.  Patient is 40 yo M presenting with chief complaint of "blurred vision in left eye" x 3 days. Patient works as Surveyor, mineralsHVAC mechanic, and water splashed in his left eye from a plumbing line on Friday 9/29. He denies any corrosive substance or foreign body getting in his eye, and denies any burning or pain. The vision in his left eye gradually became more "blurry" over the weekend, and also noticed discharge and itching of his right eye Sunday morning. He tried Visine and irrigating with water, but it provided no relief. Denies any recent fever, chills, ear pain, or sore throat. No other sick contacts at home. Reports family hx of cataracts.        History reviewed. No pertinent past medical history.  Patient Active Problem List   Diagnosis Date Noted  . Acute hepatitis 09/23/2014  . Transaminitis 09/23/2014  . Nausea and vomiting 09/23/2014    History reviewed. No pertinent surgical history.     Home Medications    Prior to Admission medications   Medication Sig Start Date End Date Taking? Authorizing Provider  amoxicillin (AMOXIL) 500 MG capsule Take 2 capsules (1,000 mg total) by mouth 2 (two) times daily. 07/03/15   Elpidio AnisShari Upstill, PA-C  loratadine (CLARITIN) 10 MG tablet Take 1 tablet (10 mg total) by mouth daily. 07/03/15   Elpidio AnisShari Upstill, PA-C  triamcinolone (NASACORT AQ) 55 MCG/ACT AERO nasal inhaler Place 2 sprays into the nose daily. 07/03/15   Elpidio AnisShari Upstill, PA-C    Family History History reviewed. No pertinent family history.  Social History Social History  Substance Use Topics  . Smoking status: Current Every Day Smoker    Packs/day: 0.50    Types: Cigarettes  . Smokeless tobacco: Never Used  . Alcohol use 0.6 oz/week    1 Cans of beer per week   Comment: beer/day     Allergies   Review of patient's allergies indicates no known allergies.   Review of Systems Review of Systems  Constitutional: Negative for chills and fever.  HENT: Negative for ear pain and sore throat.   Eyes: Positive for discharge, redness, itching and visual disturbance. Negative for photophobia and pain.     Physical Exam Updated Vital Signs BP 143/91   Pulse 92   Temp 98.8 F (37.1 C) (Oral)   Resp 18   SpO2 100%   Physical Exam  Constitutional: He appears well-developed and well-nourished. No distress.  HENT:  Head: Normocephalic and atraumatic.  Right Ear: Tympanic membrane normal.  Left Ear: Tympanic membrane normal.  Mouth/Throat: Oropharynx is clear and moist.  Eyes: EOM and lids are normal. Pupils are equal, round, and reactive to light. Lids are everted and swept, no foreign bodies found. Right eye exhibits discharge. No foreign body present in the right eye. Left eye exhibits no discharge. No foreign body present in the left eye. Right conjunctiva is injected. Left conjunctiva is injected.  Left lens opacity noted during slit lamp examination.  Neck: Normal range of motion.  Cardiovascular: Normal rate.   Pulmonary/Chest: Effort normal. No respiratory distress.  Musculoskeletal: Normal range of motion.  Neurological: He is alert.  Skin: Skin is warm and dry.  Nursing note and vitals reviewed.    ED Treatments /  Results  Labs (all labs ordered are listed, but only abnormal results are displayed) Labs Reviewed - No data to display  EKG  EKG Interpretation None       Radiology No results found.  Procedures Procedures (including critical care time)  Medications Ordered in ED Medications  tetracaine (PONTOCAINE) 0.5 % ophthalmic solution 1 drop (not administered)  fluorescein ophthalmic strip 2 strip (not administered)     Initial Impression / Assessment and Plan / ED Course  I have reviewed the triage vital signs  and the nursing notes.  Pertinent labs & imaging results that were available during my care of the patient were reviewed by me and considered in my medical decision making (see chart for details).  Clinical Course   Patient is 40 yo M who states water splashed in his left eye from plumbing line on Friday 9/29. Since then he developed gradual blurred vision in his left eye only, as well as discharge and itching in his right eye. Visual acuity noted to be 20/40 L, 20/30 R, 20/30 bilaterally. On exam under slit lamp, no foreign body or corneal abrasions, but opacity to left lens noted. Given discharge of right eye, will treat for suspected conjunctivitis with Polysporin drops. Patient is not experiencing eye pain, and no acute pathology including glaucoma suspected, but opthalmology consult placed by Rhea Bleacher, PA-C. Discussed case with Dr. Dan Europe, who agreed to see patient in office tomorrow. Patient agreeable to plan, and will schedule appointment, or return to ED for new or worsening symptoms of eye pain or any further visual changes.  Final Clinical Impressions(s) / ED Diagnoses   Final diagnoses:  Acute conjunctivitis of right eye, unspecified acute conjunctivitis type  Eye irritation  Corneal opacity    New Prescriptions Discharge Medication List as of 12/28/2015  6:35 PM    START taking these medications   Details  bacitracin-polymyxin b (POLYSPORIN) ophthalmic ointment Place 1 application into both eyes every 6 (six) hours. Apply one drop to both eyes every 6 hours while awake, Starting Mon 12/28/2015, Until Mon 01/04/2016, Print         Ivonne Andrew McGregor II, [place] 12/28/15 2101    Margarita Grizzle, MD 12/31/15 [phone]

## 2015-12-28 NOTE — ED Triage Notes (Signed)
Pt presents with c/o eye problem. Onset of eye pain and irritation, sensation that something is in his eyes, and purulent drainage from eyes Sunday morning.  He has tried visine and water washes with no relief. 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

## 2017-02-10 IMAGING — US US ABDOMEN COMPLETE
1 series · 13 of 25 positions shown · non-contrast
Comparison: None.

CLINICAL DATA: Elevated liver function studies, 3 days of abdominal
pain.

EXAM:
ULTRASOUND ABDOMEN COMPLETE

[Series 1: us abdomen complete · 0.17mm/px · 13 of 57 slices shown]
[im 1/57]
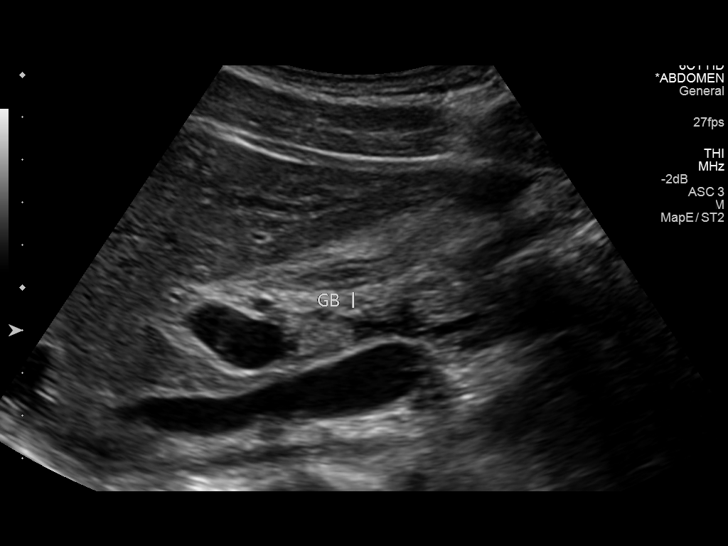
[im 5/57]
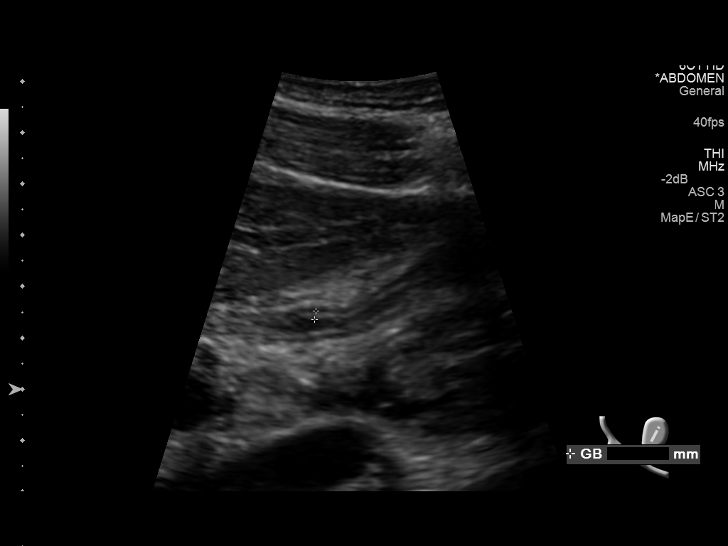
[im 10/57]
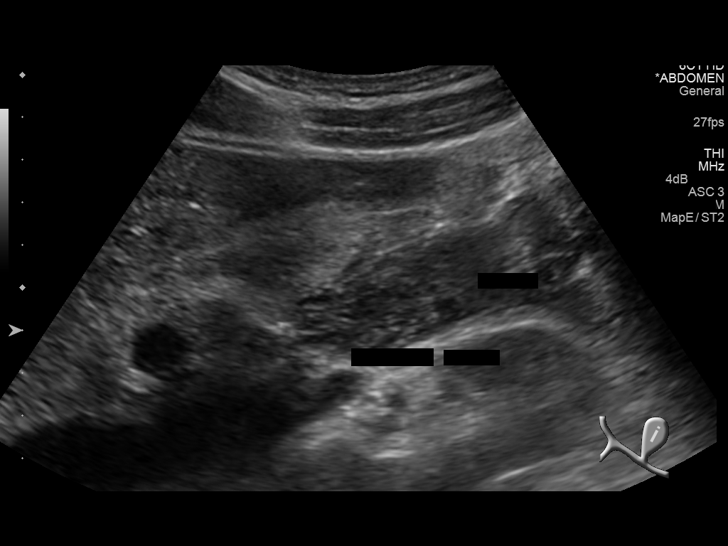
[im 15/57]
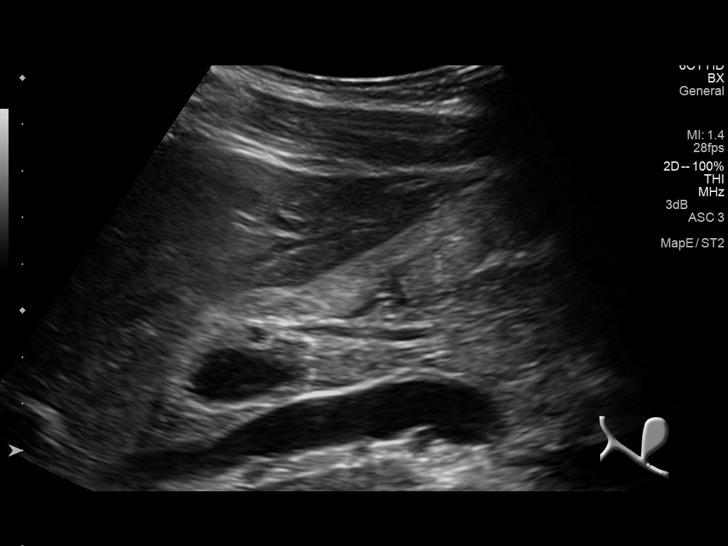
[im 19/57]
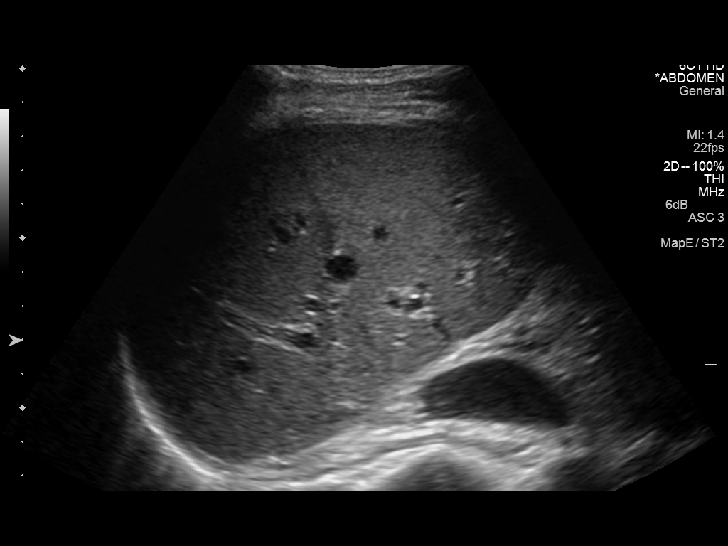
[im 24/57]
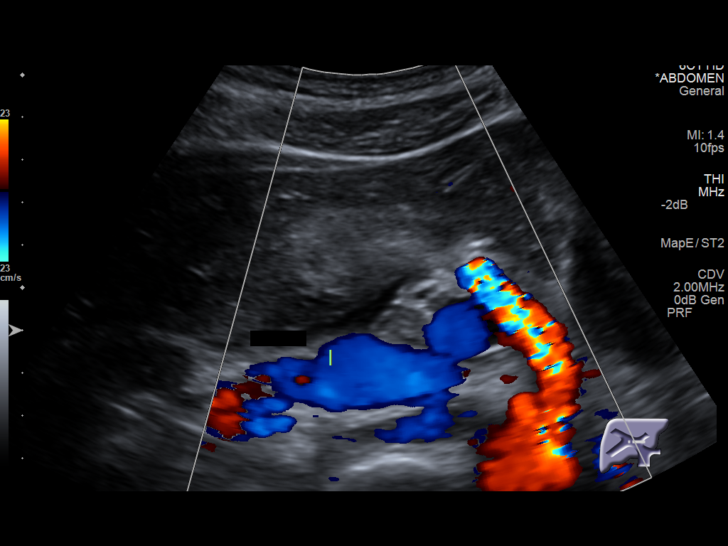
[im 29/57]
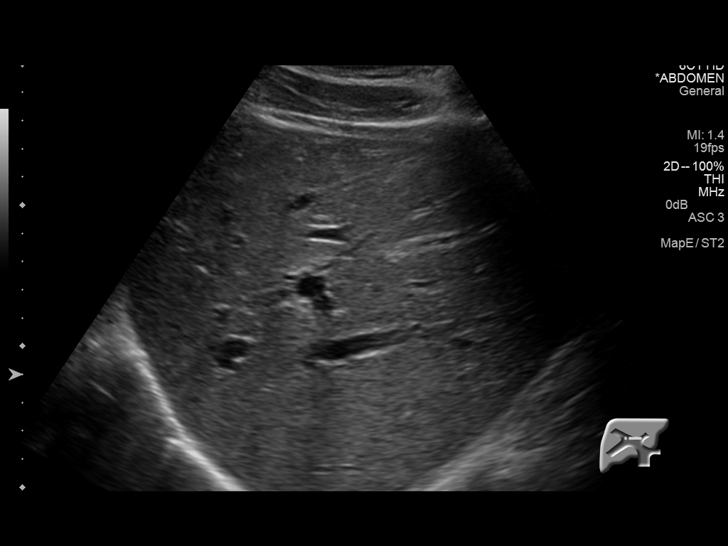
[im 33/57]
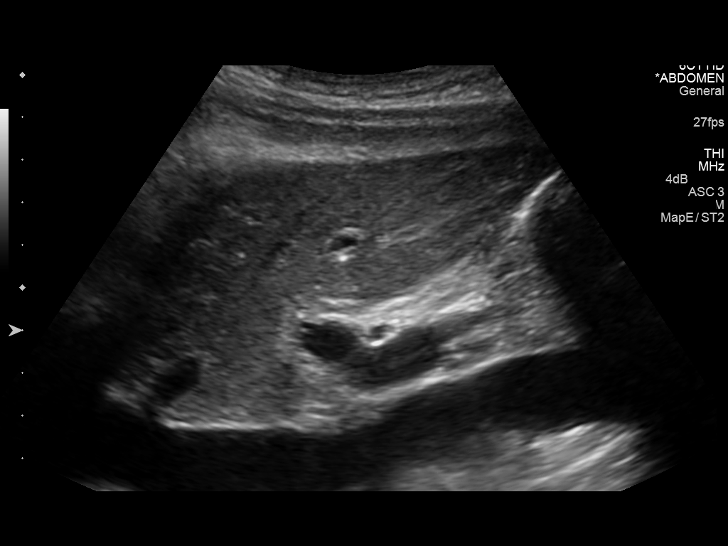
[im 38/57]
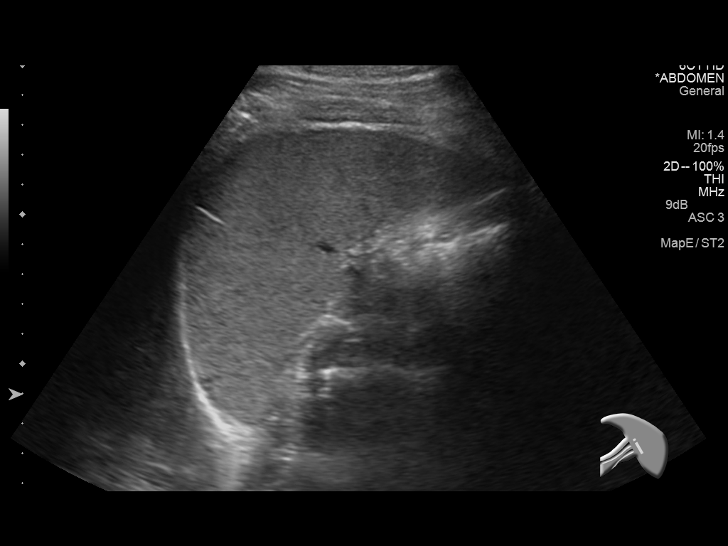
[im 43/57]
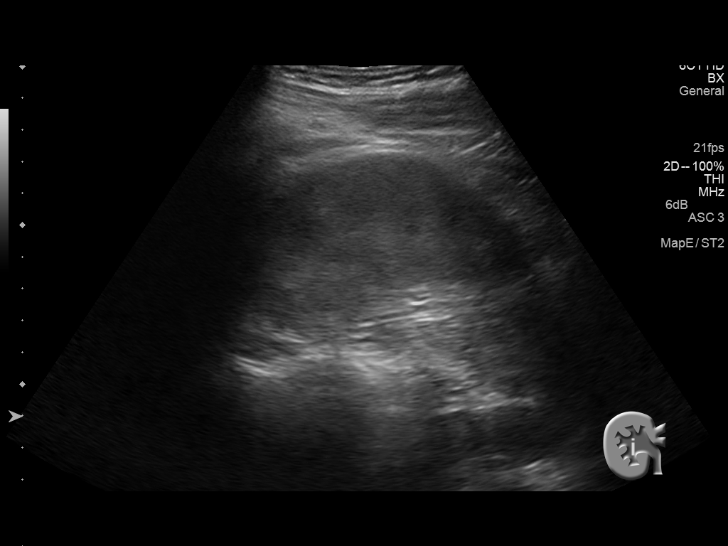
[im 47/57]
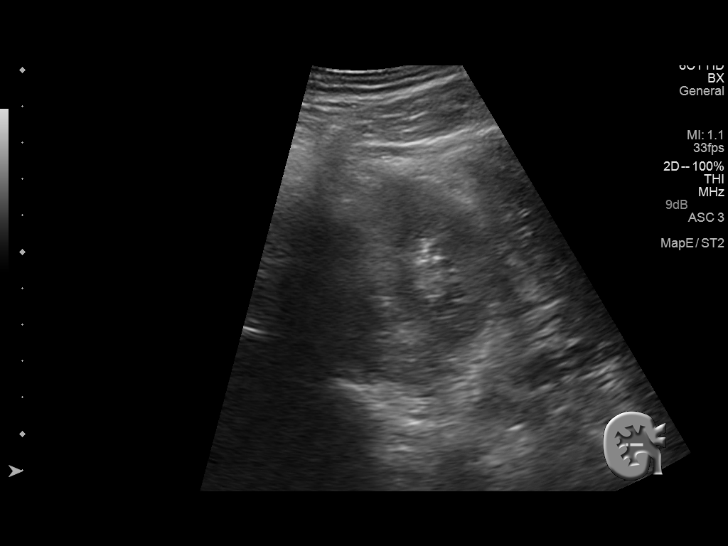
[im 52/57]
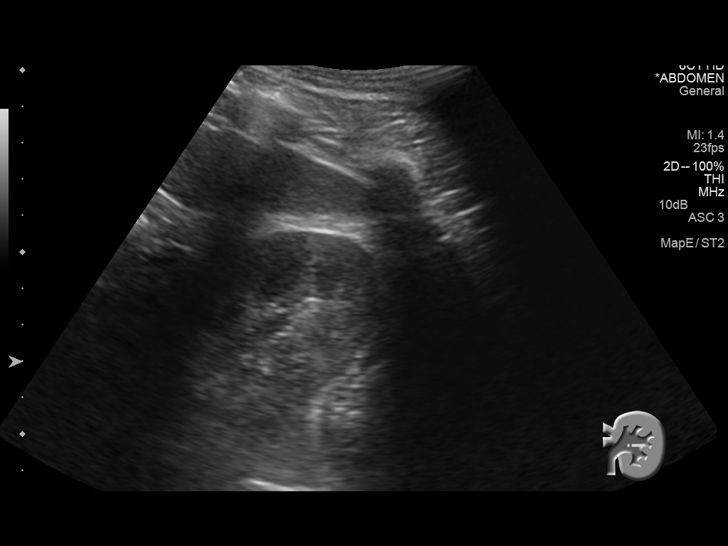
[im 57/57]
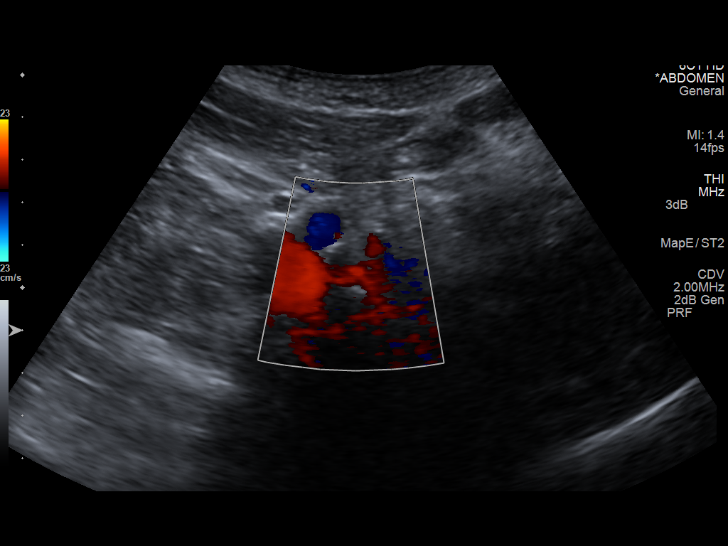

[13 of 25 positions shown; findings below may reference images not displayed]

FINDINGS: Gallbladder: The gallbladder is contracted. Measurement of the
gallbladder wall is difficult. There are no discrete stones. There
is a positive sonographic Murphy's sign.

Common bile duct: Diameter: 2.4 mm

Liver: The hepatic echotexture is normal. There is no focal mass or
ductal dilation. Inferior to the liver and gallbladder fossa there
is fluid-like echogenic material. The patient is tender in this
region.

IVC: No abnormality visualized.

Pancreas: Visualized portion unremarkable.

Spleen: Size and appearance within normal limits.

Right Kidney: Length: 10 cm. Echogenicity within normal limits. No
mass or hydronephrosis visualized.

Left Kidney: Length: 10 cm. Echogenicity within normal limits. No
mass or hydronephrosis visualized.

Abdominal aorta: No aneurysm visualized.

Other findings: Other than the echogenic fluid collection inferior
to the liver no free fluid is observed.
IMPRESSION: 1. Contracted gallbladder with positive sonographic Murphy's sign
suspicious for acute cholecystitis. No stones are evident.
2. Abnormal fluid collection containing internal echoes inferior to
the liver and gallbladder fossa but possibly related to the
gallbladder. This could reflect an abscess. Abdominal and pelvic CT
scanning now is recommended to further evaluate this finding.

## 2017-04-07 DIAGNOSIS — H25012 Cortical age-related cataract, left eye: Secondary | ICD-10-CM | POA: Diagnosis not present

## 2017-04-14 DIAGNOSIS — H2512 Age-related nuclear cataract, left eye: Secondary | ICD-10-CM | POA: Diagnosis not present

## 2017-04-14 DIAGNOSIS — H2513 Age-related nuclear cataract, bilateral: Secondary | ICD-10-CM | POA: Diagnosis not present

## 2017-06-12 ENCOUNTER — Emergency Department (HOSPITAL_COMMUNITY): Payer: 59

## 2017-06-12 ENCOUNTER — Encounter (HOSPITAL_COMMUNITY): Payer: Self-pay | Admitting: *Deleted

## 2017-06-12 ENCOUNTER — Emergency Department (HOSPITAL_COMMUNITY)
Admission: EM | Admit: 2017-06-12 | Discharge: 2017-06-12 | Disposition: A | Discharge status: Home / Self Care | Payer: 59 | Attending: Emergency Medicine | Admitting: Emergency Medicine

## 2017-06-12 ENCOUNTER — Other Ambulatory Visit: Payer: Self-pay

## 2017-06-12 DIAGNOSIS — R0789 Other chest pain: Secondary | ICD-10-CM

## 2017-06-12 DIAGNOSIS — F17210 Nicotine dependence, cigarettes, uncomplicated: Secondary | ICD-10-CM | POA: Diagnosis not present

## 2017-06-12 LAB — CBC
HCT: 46.4 % (ref 39.0–52.0)
Hemoglobin: 15.7 g/dL (ref 13.0–17.0)
MCH: 32.7 pg (ref 26.0–34.0)
MCHC: 33.8 g/dL (ref 30.0–36.0)
MCV: 96.7 fL (ref 78.0–100.0)
PLATELETS: 232 10*3/uL (ref 150–400)
RBC: 4.8 MIL/uL (ref 4.22–5.81)
RDW: 12.7 % (ref 11.5–15.5)
WBC: 10.3 10*3/uL (ref 4.0–10.5)

## 2017-06-12 LAB — I-STAT TROPONIN, ED
TROPONIN I, POC: 0 ng/mL (ref 0.00–0.08)
TROPONIN I, POC: 0 ng/mL (ref 0.00–0.08)

## 2017-06-12 LAB — BASIC METABOLIC PANEL
Anion gap: 7 (ref 5–15)
BUN: 14 mg/dL (ref 6–20)
CALCIUM: 9.2 mg/dL (ref 8.9–10.3)
CO2: 25 mmol/L (ref 22–32)
CREATININE: 1.2 mg/dL (ref 0.61–1.24)
Chloride: 106 mmol/L (ref 101–111)
Glucose, Bld: 90 mg/dL (ref 65–99)
Potassium: 3.9 mmol/L (ref 3.5–5.1)
Sodium: 138 mmol/L (ref 135–145)

## 2017-06-12 NOTE — ED Triage Notes (Signed)
The pt has had chest pressure since Friday   Also sob dizziness and nausea  Hx of anxiety and he has been under a lot of stress

## 2017-06-12 NOTE — Discharge Instructions (Signed)
Follow up with a PCP  Take 4 over the counter ibuprofen tablets 3 times a day or 2 over-the-counter naproxen tablets twice a day for pain. Also take tylenol 1000mg (2 extra strength) four times a day.

## 2017-06-12 NOTE — ED Notes (Signed)
States he does not have chest pain/pressure. Reports feeling short of breath when trying to lie down since Friday.

## 2017-06-12 NOTE — ED Provider Notes (Signed)
[facility] EMERGENCY DEPARTMENT Provider Note   CSN: [id number] Arrival date & time: 06/12/17  4098     History   Chief Complaint Chief Complaint  Patient presents with  . Chest Pain    HPI Troy Barnett is a 42 y.o. male.  42 yo M with a chief complaint of chest pain.  This been going on for the past couple days.  The patient notes this when he lays back flat.  Describes it as a pressure.  Yesterday he had some transient radiation into his left arm.  Improved with rubbing of the arm muscles.  Patient denies injury denies cough congestion or fever denies history of lower extremity edema denies history of PE or DVT.  Denies recent surgery denies prolonged travel denies recent hospitalization.  He denies testosterone use.  Patient is a smoker.  He denies hypertension hyperlipidemia or diabetes.  He does have a family history of father having an MI when he was 18.   The history is provided by the patient.  Chest Pain   This is a new problem. The current episode started yesterday. The problem occurs constantly. The problem has not changed since onset.Associated with: lying flat. The pain is present in the substernal region. The pain is at a severity of 2/10. The pain is mild. The quality of the pain is described as heavy. The pain radiates to the left arm. Duration of episode(s) is 2 days. The symptoms are aggravated by certain positions. Pertinent negatives include no abdominal pain, no fever, no headaches, no palpitations, no shortness of breath and no vomiting. He has tried nothing for the symptoms. The treatment provided no relief. Risk factors include smoking/tobacco exposure.  Pertinent negatives for past medical history include no diabetes, no DVT, no hyperlipidemia, no hypertension and no MI.  His family medical history is significant for early MI (father was 14).    History reviewed. No pertinent past medical history.  Patient Active Problem List   Diagnosis  Date Noted  . Acute hepatitis 09/23/2014  . Transaminitis 09/23/2014  . Nausea and vomiting 09/23/2014    History reviewed. No pertinent surgical history.     Home Medications    Prior to Admission medications   Medication Sig Start Date End Date Taking? Authorizing Provider  amoxicillin (AMOXIL) 500 MG capsule Take 2 capsules (1,000 mg total) by mouth 2 (two) times daily. 07/03/15   Elpidio Anis, PA-C  loratadine (CLARITIN) 10 MG tablet Take 1 tablet (10 mg total) by mouth daily. 07/03/15   Elpidio Anis, PA-C  triamcinolone (NASACORT AQ) 55 MCG/ACT AERO nasal inhaler Place 2 sprays into the nose daily. 07/03/15   Elpidio Anis, PA-C    Family History No family history on file.  Social History Social History   Tobacco Use  . Smoking status: Current Every Day Smoker    Packs/day: 0.50    Types: Cigarettes  . Smokeless tobacco: Never Used  Substance Use Topics  . Alcohol use: Yes    Alcohol/week: 0.6 oz    Types: 1 Cans of beer per week    Comment: beer/day  . Drug use: No     Allergies   Patient has no known allergies.   Review of Systems Review of Systems  Constitutional: Negative for chills and fever.  HENT: Negative for congestion and facial swelling.   Eyes: Negative for discharge and visual disturbance.  Respiratory: Negative for shortness of breath.   Cardiovascular: Negative for chest pain and palpitations.  Gastrointestinal: Negative for abdominal pain, diarrhea and vomiting.  Musculoskeletal: Negative for arthralgias and myalgias.  Skin: Negative for color change and rash.  Neurological: Negative for tremors, syncope and headaches.  Psychiatric/Behavioral: Negative for confusion and dysphoric mood.     Physical Exam Updated Vital Signs BP 136/89   Pulse 62   Temp (!) 97.3 F (36.3 C) (Oral)   Resp 19   Ht 5\' 9"  (1.753 m)   Wt 67.1 kg (148 lb)   SpO2 100%   BMI 21.86 kg/m   Physical Exam  Constitutional: He is oriented to person, place,  and time. He appears well-developed and well-nourished.  HENT:  Head: Normocephalic and atraumatic.  Eyes: EOM are normal. Pupils are equal, round, and reactive to light.  Neck: Normal range of motion. Neck supple. No JVD present.  Cardiovascular: Normal rate and regular rhythm. Exam reveals no gallop and no friction rub.  No murmur heard. Pulmonary/Chest: No respiratory distress. He has no wheezes.    Abdominal: He exhibits no distension and no mass. There is no tenderness. There is no rebound and no guarding.  Musculoskeletal: Normal range of motion.  Neurological: He is alert and oriented to person, place, and time.  Skin: No rash noted. No pallor.  Psychiatric: He has a normal mood and affect. His behavior is normal.  Nursing note and vitals reviewed.    ED Treatments / Results  Labs (all labs ordered are listed, but only abnormal results are displayed) Labs Reviewed  BASIC METABOLIC PANEL  CBC  I-STAT TROPONIN, ED  I-STAT TROPONIN, ED    EKG  EKG Interpretation  Date/Time:  Monday June 12 2017 03:42:18 EDT Ventricular Rate:  67 PR Interval:  162 QRS Duration: 84 QT Interval:  390 QTC Calculation: 412 R Axis:   92 Text Interpretation:  Normal sinus rhythm Rightward axis Borderline ECG No significant change since last tracing Confirmed by Melene PlanFloyd, Kek[place] Fyock [phone]) on 06/12/2017 7:37:48 AM       Radiology Dg Chest 2 View  Result Date: 06/12/2017 CLINICAL DATA:  Initial evaluation for acute chest pressure. EXAM: CHEST - 2 VIEW COMPARISON:  Prior radiograph from 04/27/2005 FINDINGS: The cardiac and mediastinal silhouettes are stable in size and contour, and remain within normal limits. The lungs are normally inflated. No airspace consolidation, pleural effusion, or pulmonary edema is identified. There is no pneumothorax. No acute osseous abnormality identified. IMPRESSION: No active cardiopulmonary disease. Electronically Signed   By: Rise MuBenjamin  McClintock M.D.   On: 06/12/2017  04:24    Procedures Procedures (including critical care time)  Medications Ordered in ED Medications - No data to display   Initial Impression / Assessment and Plan / ED Course  I have reviewed the triage vital signs and the nursing notes.  Pertinent labs & imaging results that were available during my care of the patient were reviewed by me and considered in my medical decision making (see chart for details).     42 yo M with a chief complaint of chest pain.  Atypical in nature.  The patient does have a family history of an early MI, will obtain a delta troponin.  Initial troponin negative.  EKG unchanged from prior.  Chest x-Raymundo unremarkable, as viewed by me.  Pain is somewhat reproducible on palpation of the chest wall.  Will treat as muscular skeletal. Doubt MI or PE.  PERC negative.   Discussed smoking cessation with patient and was they were offerred resources to help stop.  Total time was 5  min CPT code 16109.   9:50 AM:  I have discussed the diagnosis/risks/treatment options with the patient and believe the pt to be eligible for discharge home to follow-up with PCP. We also discussed returning to the ED immediately if new or worsening sx occur. We discussed the sx which are most concerning (e.g., sudden worsening pain, fever, inability to tolerate by mouth) that necessitate immediate return. Medications administered to the patient during their visit and any new prescriptions provided to the patient are listed below.  Medications given during this visit Medications - No data to display   The patient appears reasonably screen and/or stabilized for discharge and I doubt any other medical condition or other [facility] requiring further screening, evaluation, or treatment in the ED at this time prior to discharge.    Final Clinical Impressions(s) / ED Diagnoses   Final diagnoses:  Atypical chest pain    ED Discharge Orders    None       Melene Plan, DO 06/12/17 6045

## 2018-04-23 ENCOUNTER — Other Ambulatory Visit: Payer: Self-pay

## 2018-04-23 ENCOUNTER — Encounter (HOSPITAL_COMMUNITY): Payer: Self-pay

## 2018-04-23 ENCOUNTER — Ambulatory Visit (HOSPITAL_COMMUNITY)
Admission: EM | Admit: 2018-04-23 | Discharge: 2018-04-23 | Disposition: A | Discharge status: Home / Self Care | Payer: 59 | Attending: Family Medicine | Admitting: Family Medicine

## 2018-04-23 DIAGNOSIS — J069 Acute upper respiratory infection, unspecified: Secondary | ICD-10-CM | POA: Insufficient documentation

## 2018-04-23 DIAGNOSIS — B9789 Other viral agents as the cause of diseases classified elsewhere: Secondary | ICD-10-CM | POA: Diagnosis not present

## 2018-04-23 MED ORDER — HYDROCODONE-HOMATROPINE 5-1.5 MG/5ML PO SYRP: 5.0000 mL | ORAL_SOLUTION | Freq: Four times a day (QID) | ORAL | 0 refills | Status: DC | PRN

## 2018-04-23 MED ORDER — AMOXICILLIN 500 MG PO CAPS
1000.0000 mg | ORAL_CAPSULE | Freq: Two times a day (BID) | ORAL | 0 refills | Status: DC
Start: 2018-04-23 — End: 2018-10-12

## 2018-04-23 NOTE — ED Triage Notes (Signed)
Pt cc cough and congestion . X 3 days

## 2018-04-23 NOTE — ED Provider Notes (Signed)
[facility]    CSN: [id number] Arrival date & time: 04/23/18  0944     History   Chief Complaint Chief Complaint  Patient presents with  . Cough    HPI Troy Barnett is a 43 y.o. male.   This a 43 year old man who is making his first visit to the urgent care at [facility] in over 3 years.  He is complaining about cough and congestion  X 3 days.  Patient works in a [employer] and he does smoke.  Patient's had no shortness of breath, abdominal pain, chest pain, high fever, sweats or chills.     History reviewed. No pertinent past medical history.  Patient Active Problem List   Diagnosis Date Noted  . Acute hepatitis 09/23/2014  . Transaminitis 09/23/2014  . Nausea and vomiting 09/23/2014    History reviewed. No pertinent surgical history.     Home Medications    Prior to Admission medications   Medication Sig Start Date End Date Taking? Authorizing Provider  amoxicillin (AMOXIL) 500 MG capsule Take 2 capsules (1,000 mg total) by mouth 2 (two) times daily. 04/23/18   Elvina Sidle, MD  HYDROcodone-homatropine (HYDROMET) 5-1.5 MG/5ML syrup Take 5 mLs by mouth every 6 (six) hours as needed for cough. 04/23/18   Elvina Sidle, MD    Family History History reviewed. No pertinent family history.  Social History Social History   Tobacco Use  . Smoking status: Current Every Day Smoker    Packs/day: 0.50    Types: Cigarettes  . Smokeless tobacco: Never Used  Substance Use Topics  . Alcohol use: Yes    Alcohol/week: 1.0 standard drinks    Types: 1 Cans of beer per week    Comment: beer/day  . Drug use: No     Allergies   Patient has no known allergies.   Review of Systems Review of Systems   Physical Exam Triage Vital Signs ED Triage Vitals  Enc Vitals Group     BP 04/23/18 1014 100/62     Pulse Rate 04/23/18 1014 78     Resp 04/23/18 1014 18     Temp 04/23/18 1014 98.2 F (36.8 C)     Temp Source 04/23/18 1014 Oral     SpO2 04/23/18  1014 99 %     Weight 04/23/18 1013 150 lb (68 kg)     Height --      Head Circumference --      Peak Flow --      Pain Score 04/23/18 1013 7     Pain Loc --      Pain Edu? --      Excl. in GC? --    No data found.  Updated Vital Signs BP 100/62 (BP Location: Right Arm)   Pulse 78   Temp 98.2 F (36.8 C) (Oral)   Resp 18   Wt 68 kg   SpO2 99%   BMI 22.15 kg/m    Physical Exam Vitals signs and nursing note reviewed.  Constitutional:      General: He is not in acute distress.    Appearance: Normal appearance. He is normal weight. He is not ill-appearing.  HENT:     Head: Normocephalic and atraumatic.     Comments: Lateral retracted TMs with normal landmarks    Right Ear: There is no impacted cerumen.     Left Ear: There is no impacted cerumen.     Nose: Congestion present.     Mouth/Throat:  Mouth: Mucous membranes are moist.     Pharynx: Oropharynx is clear.  Eyes:     Conjunctiva/sclera: Conjunctivae normal.     Comments: Cataract obvious on the left  Neck:     Musculoskeletal: Normal range of motion and neck supple.  Cardiovascular:     Rate and Rhythm: Normal rate and regular rhythm.     Pulses: Normal pulses.     Heart sounds: Normal heart sounds.  Pulmonary:     Effort: Pulmonary effort is normal.     Breath sounds: Rhonchi present.  Musculoskeletal: Normal range of motion.  Skin:    General: Skin is warm and dry.  Neurological:     General: No focal deficit present.     Mental Status: He is alert and oriented to person, place, and time.  Psychiatric:        Mood and Affect: Mood normal.        Behavior: Behavior normal.        Thought Content: Thought content normal.        Judgment: Judgment normal.      UC Treatments / Results  Labs (all labs ordered are listed, but only abnormal results are displayed) Labs Reviewed - No data to display  EKG None  Radiology No results found.  Procedures Procedures (including critical care  time)  Medications Ordered in UC Medications - No data to display  Initial Impression / Assessment and Plan / UC Course  I have reviewed the triage vital signs and the nursing notes.  Pertinent labs & imaging results that were available during my care of the patient were reviewed by me and considered in my medical decision making (see chart for details).    Final Clinical Impressions(s) / UC Diagnoses   Final diagnoses:  Viral URI with cough   Discharge Instructions   None    ED Prescriptions    Medication Sig Dispense Auth. Provider   amoxicillin (AMOXIL) 500 MG capsule Take 2 capsules (1,000 mg total) by mouth 2 (two) times daily. 40 capsule Elvina SidleLauenstein, Najir Roop, MD   HYDROcodone-homatropine (HYDROMET) 5-1.5 MG/5ML syrup Take 5 mLs by mouth every 6 (six) hours as needed for cough. 60 mL Elvina SidleLauenstein, Tobechukwu Emmick, MD     Controlled Substance Prescriptions [place] Controlled Substance Registry consulted? Yes, I have consulted the  Controlled Substances Registry for this patient, and feel the risk/benefit ratio today is favorable for proceeding with this prescription for a controlled substance.   Elvina SidleLauenstein, Clydell Sposito, MD 04/23/18 1042

## 2018-10-12 ENCOUNTER — Ambulatory Visit (HOSPITAL_COMMUNITY)
Admission: EM | Admit: 2018-10-12 | Discharge: 2018-10-12 | Disposition: A | Discharge status: Home / Self Care | Payer: 59 | Attending: Internal Medicine | Admitting: Internal Medicine

## 2018-10-12 ENCOUNTER — Encounter (HOSPITAL_COMMUNITY): Payer: Self-pay | Admitting: Emergency Medicine

## 2018-10-12 ENCOUNTER — Other Ambulatory Visit: Payer: Self-pay

## 2018-10-12 DIAGNOSIS — F17200 Nicotine dependence, unspecified, uncomplicated: Secondary | ICD-10-CM | POA: Insufficient documentation

## 2018-10-12 DIAGNOSIS — B9789 Other viral agents as the cause of diseases classified elsewhere: Secondary | ICD-10-CM | POA: Diagnosis present

## 2018-10-12 DIAGNOSIS — Z20828 Contact with and (suspected) exposure to other viral communicable diseases: Secondary | ICD-10-CM | POA: Diagnosis not present

## 2018-10-12 DIAGNOSIS — R05 Cough: Secondary | ICD-10-CM | POA: Diagnosis not present

## 2018-10-12 DIAGNOSIS — R030 Elevated blood-pressure reading, without diagnosis of hypertension: Secondary | ICD-10-CM | POA: Diagnosis not present

## 2018-10-12 DIAGNOSIS — J3089 Other allergic rhinitis: Secondary | ICD-10-CM | POA: Diagnosis not present

## 2018-10-12 DIAGNOSIS — F17210 Nicotine dependence, cigarettes, uncomplicated: Secondary | ICD-10-CM

## 2018-10-12 DIAGNOSIS — J069 Acute upper respiratory infection, unspecified: Secondary | ICD-10-CM | POA: Insufficient documentation

## 2018-10-12 DIAGNOSIS — R0602 Shortness of breath: Secondary | ICD-10-CM

## 2018-10-12 MED ORDER — BENZONATATE 100 MG PO CAPS: 100.0000 mg | ORAL_CAPSULE | Freq: Three times a day (TID) | ORAL | 0 refills | Status: DC | PRN

## 2018-10-12 MED ORDER — ALBUTEROL SULFATE HFA 108 (90 BASE) MCG/ACT IN AERS: 1.0000 | INHALATION_SPRAY | Freq: Four times a day (QID) | RESPIRATORY_TRACT | 0 refills | Status: DC | PRN

## 2018-10-12 NOTE — Discharge Instructions (Addendum)
We will manage this as a viral syndrome. For sore throat or cough try using a honey-based tea. Use 3 teaspoons of honey with juice squeezed from half lemon. Place shaved pieces of ginger into 1/2-1 cup of water and warm over stove top. Then mix the ingredients and repeat every 4 hours as needed. Please take Tylenol 500mg  every 6 hours. Hydrate very well with at least 2 liters of water. Eat light meals such as soups to replenish electrolytes and soft fruits, veggies. Start an antihistamine like Zyrtec, Allegra or Claritin for postnasal drainage, sinus congestion.  You can take this together with pseudoephedrine (Sudafed) at a dose of 60 mg 3 times a day as needed for the same kind of congestion. Make sure you ask for pseudoephedrine directly from the pharmacist as it is not available on the shelf.  However, do not take Sudafed if you have high blood pressure or are prone to palpitations, have abnormal heart rhythms.

## 2018-10-12 NOTE — ED Triage Notes (Signed)
PT reports cough and SOB 1 week ago

## 2018-10-12 NOTE — ED Provider Notes (Addendum)
MRN: [id number] DOB: 1975/07/09  Subjective:   Troy Barnett is a 43 y.o. male presenting for 1 week history of moderate constant productive cough that elicits shortness of breath.  Has not tried medications for relief.  Review of systems as below.  Patient also has chronic sinus issues including sinus congestion, postnasal drainage.  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  He knows he has allergies but does not take any allergy medications.  He is thinking about quitting smoking.  Otherwise he does not have any chronic conditions.  Does not have a PCP. He is not currently taking any medications and has no known food or drug allergies.  Denies past medical and surgical history.  Review of Systems  Constitutional: Negative for fever and malaise/fatigue.  HENT: Positive for congestion. Negative for ear pain, sinus pain and sore throat.   Eyes: Negative for blurred vision, double vision, discharge and redness.  Respiratory: Positive for cough and shortness of breath. Negative for hemoptysis and wheezing.   Cardiovascular: Negative for chest pain.  Gastrointestinal: Negative for abdominal pain, diarrhea, nausea and vomiting.  Genitourinary: Negative for dysuria, flank pain and hematuria.  Musculoskeletal: Negative for myalgias.  Skin: Negative for rash.  Neurological: Negative for dizziness, weakness and headaches.  Psychiatric/Behavioral: Negative for depression and substance abuse.    Objective:   Vitals: BP (!) 142/101   Pulse 82   Temp 98.6 F (37 C) (Oral)   Resp 16   SpO2 98%   BP Readings from Last 3 Encounters:  10/12/18 (!) 142/101  04/23/18 100/62  06/12/17 (!) 122/94   Physical Exam Constitutional:      General: He is not in acute distress.    Appearance: Normal appearance. He is well-developed and normal weight. He is not ill-appearing, toxic-appearing or diaphoretic.  HENT:     Head: Normocephalic and atraumatic.     Right Ear: Tympanic  membrane, ear canal and external ear normal. There is no impacted cerumen.     Left Ear: Tympanic membrane, ear canal and external ear normal. There is no impacted cerumen.     Nose: Nose normal. No congestion or rhinorrhea.     Mouth/Throat:     Mouth: Mucous membranes are moist.     Pharynx: Oropharynx is clear. No oropharyngeal exudate or posterior oropharyngeal erythema.  Eyes:     General: No scleral icterus.       Right eye: No discharge.        Left eye: No discharge.     Extraocular Movements: Extraocular movements intact.     Conjunctiva/sclera: Conjunctivae normal.     Pupils: Pupils are equal, round, and reactive to light.  Neck:     Musculoskeletal: Normal range of motion and neck supple. No neck rigidity or muscular tenderness.  Cardiovascular:     Rate and Rhythm: Normal rate and regular rhythm.     Heart sounds: Normal heart sounds. No murmur. No friction rub. No gallop.   Pulmonary:     Effort: Pulmonary effort is normal. No respiratory distress.     Breath sounds: Normal breath sounds. No stridor. No wheezing, rhonchi or rales.  Neurological:     General: No focal deficit present.     Mental Status: He is alert and oriented to person, place, and time.  Psychiatric:        Mood and Affect: Mood normal.        Behavior: Behavior normal.  Thought Content: Thought content normal.      Assessment and Plan :   1. Viral URI with cough   2. Allergic rhinitis due to other allergic trigger, unspecified seasonality   3. Tobacco use disorder   4. Elevated blood pressure reading      Likely viral in etiology. Counseled patient on nature of COVID-19 including modes of transmission, diagnostic testing, management and supportive care.  Offered symptomatic relief. COVID 19 testing is pending.  Use Tessalon, albuterol inhaler as needed for cough and shortness of breath, respectively.  Counseled patient on potential for adverse effects with medications  prescribed/recommended today, ER and return-to-clinic precautions discussed, patient verbalized understanding.  Patient has elevated blood pressure today, will have him follow-up with PCP.  Counseled on need to establish with a PCP for further work-up and follow-up.  I placed patient in a work queue with [facility] for PCP assistance.    Wallis BambergMani, Adon Gehlhausen, PA-C 10/12/18 1210

## 2018-10-13 LAB — NOVEL CORONAVIRUS, NAA (HOSP ORDER, SEND-OUT TO REF LAB; TAT 18-24 HRS): SARS-CoV-2, NAA: NOT DETECTED

## 2018-12-04 ENCOUNTER — Ambulatory Visit (HOSPITAL_COMMUNITY)
Admission: EM | Admit: 2018-12-04 | Discharge: 2018-12-04 | Disposition: A | Discharge status: Home / Self Care | Payer: 59 | Attending: Family Medicine | Admitting: Family Medicine

## 2018-12-04 ENCOUNTER — Other Ambulatory Visit: Payer: Self-pay

## 2018-12-04 ENCOUNTER — Encounter (HOSPITAL_COMMUNITY): Payer: Self-pay | Admitting: Emergency Medicine

## 2018-12-04 DIAGNOSIS — Z20828 Contact with and (suspected) exposure to other viral communicable diseases: Secondary | ICD-10-CM | POA: Insufficient documentation

## 2018-12-04 DIAGNOSIS — Z20822 Contact with and (suspected) exposure to covid-19: Secondary | ICD-10-CM

## 2018-12-04 NOTE — ED Triage Notes (Signed)
Pt sts co workers wife tested positive for covid and now wanting testing; pt denies sx

## 2018-12-04 NOTE — Discharge Instructions (Signed)
If your Covid-19 test is positive, you will get a phone call from [facility] regarding your results. If your Covid-19 test is negative, you will NOT get a phone call from [facility] with your results. You may view your results on MyChart. If you do not have a MyChart account, sign up instructions are in your discharge papers. ° °

## 2018-12-04 NOTE — ED Provider Notes (Signed)
[facility]   [id number] 12/04/18 Arrival Time: [facility]:  1. Close Exposure to Covid-19 Virus     COVID-19 testing sent. To self-quarantine until results are available.  Follow-up Information    [facility].   Specialty: Urgent Care Why: As needed. Contact information: [address] [place] [phone]          Reviewed expectations re: course of current medical issues. Questions answered. Outlined signs and symptoms indicating need for more acute intervention. Patient verbalized understanding. After Visit Summary given.   SUBJECTIVE: History from: patient. Troy Barnett is a 43 y.o. male who requests COVID-19 testing. Known COVID-19 contact: co-worker with wife who tested positive. Recent travel: none. Denies: runny nose, congestion, fever, cough, sore throat, difficulty breathing and headache.  ROS: As per HPI.   OBJECTIVE:  Vitals:   12/04/18 1323  BP: 131/87  Pulse: 89  Resp: 18  Temp: 98.3 F (36.8 C)  TempSrc: Temporal  SpO2: 99%    General appearance: alert; no distress Eyes: PERRLA; EOMI; conjunctiva normal HENT: [place]; AT; nasal mucosa normal; oral mucosa normal Neck: supple  Lungs: clear to auscultation bilaterally; unlabored Heart: regular rate and rhythm Abdomen: soft, non-tender Extremities: no edema Skin: warm and dry Neurologic: normal gait Psychological: alert and cooperative; normal mood and affect  Labs: Labs Reviewed  NOVEL CORONAVIRUS, NAA (HOSP ORDER, SEND-OUT TO REF LAB; TAT 18-24 HRS)     No Known Allergies   Social History   Socioeconomic History  . Marital status: Significant Other    Spouse name: Not on file  . Number of children: Not on file  . Years of education: Not on file  . Highest education level: Not on file  Occupational History  . Not on file  Social Needs  . Financial resource strain: Not on file  . Food  insecurity    Worry: Not on file    Inability: Not on file  . Transportation needs    Medical: Not on file    Non-medical: Not on file  Tobacco Use  . Smoking status: Current Every Day Smoker    Packs/day: 0.50    Types: Cigarettes  . Smokeless tobacco: Never Used  Substance and Sexual Activity  . Alcohol use: Yes    Alcohol/week: 1.0 standard drinks    Types: 1 Cans of beer per week    Comment: beer/day  . Drug use: No  . Sexual activity: Yes  Lifestyle  . Physical activity    Days per week: Not on file    Minutes per session: Not on file  . Stress: Not on file  Relationships  . Social Herbalist on phone: Not on file    Gets together: Not on file    Attends religious service: Not on file    Active member of club or organization: Not on file    Attends meetings of clubs or organizations: Not on file    Relationship status: Not on file  . Intimate partner violence    Fear of current or ex partner: Not on file    Emotionally abused: Not on file    Physically abused: Not on file    Forced sexual activity: Not on file  Other Topics Concern  . Not on file  Social History Narrative  . Not on file   History reviewed. No pertinent family history. History reviewed. No pertinent surgical history.  Mardella LaymanHagler, Lizet Kelso, MD 12/04/18 [phone]

## 2018-12-06 LAB — NOVEL CORONAVIRUS, NAA (HOSP ORDER, SEND-OUT TO REF LAB; TAT 18-24 HRS): SARS-CoV-2, NAA: NOT DETECTED

## 2018-12-10 ENCOUNTER — Encounter (HOSPITAL_COMMUNITY): Payer: Self-pay

## 2019-03-20 ENCOUNTER — Other Ambulatory Visit: Payer: Self-pay

## 2019-03-20 ENCOUNTER — Ambulatory Visit (HOSPITAL_COMMUNITY)
Admission: EM | Admit: 2019-03-20 | Discharge: 2019-03-20 | Disposition: A | Discharge status: Home / Self Care | Payer: 59 | Attending: Family Medicine | Admitting: Family Medicine

## 2019-03-20 DIAGNOSIS — J310 Chronic rhinitis: Secondary | ICD-10-CM | POA: Diagnosis not present

## 2019-03-20 DIAGNOSIS — F17210 Nicotine dependence, cigarettes, uncomplicated: Secondary | ICD-10-CM | POA: Insufficient documentation

## 2019-03-20 DIAGNOSIS — Z79899 Other long term (current) drug therapy: Secondary | ICD-10-CM | POA: Diagnosis not present

## 2019-03-20 DIAGNOSIS — R439 Unspecified disturbances of smell and taste: Secondary | ICD-10-CM | POA: Insufficient documentation

## 2019-03-20 DIAGNOSIS — J329 Chronic sinusitis, unspecified: Secondary | ICD-10-CM | POA: Insufficient documentation

## 2019-03-20 DIAGNOSIS — Z20828 Contact with and (suspected) exposure to other viral communicable diseases: Secondary | ICD-10-CM | POA: Diagnosis not present

## 2019-03-20 DIAGNOSIS — R519 Headache, unspecified: Secondary | ICD-10-CM | POA: Diagnosis present

## 2019-03-20 MED ORDER — SALINE SPRAY 0.65 % NA SOLN: 2.0000 | NASAL | 0 refills | Status: DC | PRN

## 2019-03-20 MED ORDER — OXYMETAZOLINE HCL 0.05 % NA SOLN: 1.0000 | Freq: Two times a day (BID) | NASAL | 0 refills | Status: AC

## 2019-03-20 NOTE — ED Provider Notes (Signed)
[facility]    CSN: [id number] Arrival date & time: 03/20/19  1037      History   Chief Complaint Chief Complaint  Patient presents with  . URI    HPI Troy Barnett is a 43 y.o. male.   Patient reports to urgent care today for 3 days of sinus headache and congestion. He also reports new onset loss of taste and small since yesterday. He reports starting 3 days ago he has had right sided headache involving his facial bones. He endorses a feeling of nasal congestion on his right side with associated purulent discharge at times. He also endorses a change in his chronic cough but denies additional productivity.  He denies fever and chills, shortness of breath or chest pain. He has taken tylenol for the headache with minor improvement.   He reports a history of sinus problems.      No past medical history on file.  Patient Active Problem List   Diagnosis Date Noted  . Acute hepatitis 09/23/2014  . Transaminitis 09/23/2014  . Nausea and vomiting 09/23/2014    No past surgical history on file.     Home Medications    Prior to Admission medications   Medication Sig Start Date End Date Taking? Authorizing Provider  albuterol (VENTOLIN HFA) 108 (90 Base) MCG/ACT inhaler Inhale 1-2 puffs into the lungs every 6 (six) hours as needed for wheezing or shortness of breath. 10/12/18   Wallis Bamberg, PA-C  benzonatate (TESSALON) 100 MG capsule Take 1-2 capsules (100-200 mg total) by mouth 3 (three) times daily as needed. Patient not taking: Reported on 12/04/2018 10/12/18   Wallis Bamberg, PA-C  oxymetazoline (AFRIN) 0.05 % nasal spray Place 1 spray into both nostrils 2 (two) times daily for 3 days. 03/20/19 03/23/19  Elizette Shek, Veryl Speak, PA-C  sodium chloride (OCEAN) 0.65 % SOLN nasal spray Place 2 sprays into both nostrils as needed for up to 15 days for congestion. 03/20/19 04/04/19  Clide Remmers, Veryl Speak, PA-C    Family History No family history on file.  Social History Social History    Tobacco Use  . Smoking status: Current Every Day Smoker    Packs/day: 0.50    Types: Cigarettes  . Smokeless tobacco: Never Used  Substance Use Topics  . Alcohol use: Yes    Alcohol/week: 1.0 standard drinks    Types: 1 Cans of beer per week    Comment: beer/day  . Drug use: No     Allergies   Patient has no known allergies.   Review of Systems Review of Systems  Constitutional: Negative for chills and fever.  HENT: Positive for congestion, postnasal drip, rhinorrhea, sinus pressure and sinus pain. Negative for ear discharge, ear pain and sore throat.   Eyes: Negative for photophobia, pain, redness and visual disturbance.  Respiratory: Positive for cough. Negative for chest tightness, shortness of breath and wheezing.   Cardiovascular: Negative for chest pain and palpitations.  Gastrointestinal: Negative for abdominal pain, diarrhea, nausea and vomiting.  Genitourinary: Negative for dysuria.  Musculoskeletal: Negative for arthralgias, back pain and myalgias.  Skin: Negative for color change and rash.  Neurological: Positive for headaches. Negative for syncope and facial asymmetry.  All other systems reviewed and are negative.    Physical Exam Triage Vital Signs ED Triage Vitals  Enc Vitals Group     BP 03/20/19 1116 (!) 143/90     Pulse Rate 03/20/19 1116 61     Resp 03/20/19 1116 14  Temp 03/20/19 1116 97.8 F (36.6 C)     Temp Source 03/20/19 1116 Oral     SpO2 03/20/19 1116 97 %     Weight --      Height --      Head Circumference --      Peak Flow --      Pain Score 03/20/19 1119 7     Pain Loc --      Pain Edu? --      Excl. in [place]? --    No data found.  Updated Vital Signs BP (!) 143/90 (BP Location: Left Arm)   Pulse 61   Temp 97.8 F (36.6 C) (Oral)   Resp 14   SpO2 97%   Visual Acuity Right Eye Distance:   Left Eye Distance:   Bilateral Distance:    Right Eye Near:   Left Eye Near:    Bilateral Near:     Physical Exam Vitals and  nursing note reviewed.  Constitutional:      Appearance: He is well-developed.  HENT:     Head: Normocephalic and atraumatic.     Right Ear: Tympanic membrane and external ear normal.     Left Ear: Tympanic membrane and external ear normal.     Nose: Congestion and rhinorrhea present. No nasal deformity, septal deviation or signs of injury.     Right Turbinates: Swollen (with erythema and evidenc of discharge).     Right Sinus: Maxillary sinus tenderness and frontal sinus tenderness present.     Mouth/Throat:     Mouth: Mucous membranes are dry.     Pharynx: No oropharyngeal exudate or posterior oropharyngeal erythema.  Eyes:     Conjunctiva/sclera: Conjunctivae normal.     Pupils: Pupils are equal, round, and reactive to light.  Cardiovascular:     Rate and Rhythm: Normal rate and regular rhythm.     Pulses: Normal pulses.     Heart sounds: No murmur. No friction rub. No gallop.   Pulmonary:     Effort: Pulmonary effort is normal. No respiratory distress.     Breath sounds: Normal breath sounds. No wheezing, rhonchi or rales.  Abdominal:     Palpations: Abdomen is soft.     Tenderness: There is no abdominal tenderness.  Musculoskeletal:     Cervical back: Normal range of motion and neck supple.  Lymphadenopathy:     Cervical: No cervical adenopathy.  Skin:    General: Skin is warm and dry.  Neurological:     General: No focal deficit present.     Mental Status: He is alert and oriented to person, place, and time.      UC Treatments / Results  Labs (all labs ordered are listed, but only abnormal results are displayed) Labs Reviewed  NOVEL CORONAVIRUS, NAA (HOSP ORDER, SEND-OUT TO REF LAB; TAT 18-24 HRS)    EKG   Radiology No results found.  Procedures Procedures (including critical care time)  Medications Ordered in UC Medications - No data to display  Initial Impression / Assessment and Plan / UC Course  I have reviewed the triage vital signs and the  nursing notes.  Pertinent labs & imaging results that were available during my care of the patient were reviewed by me and considered in my medical decision making (see chart for details).     #Rhinosinusitis - 3 day history , will try symptomatic and decongestant therapy with strict cut off of 3 days for afrin. Patient understands clearly to  stop the afrin after 3 days. He will return in 1 week if not improving or has worsening. Covid PCR sent. Precautions discussed.   Final Clinical Impressions(s) / UC Diagnoses   Final diagnoses:  Rhinosinusitis     Discharge Instructions     Use the afrin 2 times a day for 3 days only. After administering the afrin, wait 5 minutes and then utilize the nasal saline spray.   You may use the nasal saline spray as much as you like throughout the day.  If your symptoms do not improve or they worsen in 1 week, please return   Go directly to the Emergency Department or call 911 if you have severe chest pain, shortness of breath, severe diarrhea or feel as though you might pass out.    If your Covid-19 test is positive, you will [facility] your results. Negative test results are not called. Both positive and negative results area always visible on MyChart. If you do not have a MyChart account, sign up instructions are in your discharge papers.   Persons who are directed to care for themselves at home may discontinue isolation under the following conditions:  . At least 10 days have passed since symptom onset and . At least 24 hours have passed without running a fever (this means without the use of fever-reducing medications) and . Other symptoms have improved.  Persons infected with COVID-19 who never develop symptoms may discontinue isolation and other precautions 10 days after the date of their first positive COVID-19 test.    ED Prescriptions    Medication Sig Dispense Auth. Provider   oxymetazoline  (AFRIN) 0.05 % nasal spray Place 1 spray into both nostrils 2 (two) times daily for 3 days. 0.6 mL Earleen Aoun, Veryl SpeakJacob E, PA-C   sodium chloride (OCEAN) 0.65 % SOLN nasal spray Place 2 sprays into both nostrils as needed for up to 15 days for congestion. 30 mL Lilith Solana, Veryl SpeakJacob E, PA-C     PDMP not reviewed this encounter.   Hermelinda Medicusarr, Nieves Chapa E, PA-C 03/20/19 1237

## 2019-03-20 NOTE — Discharge Instructions (Addendum)
Use the afrin 2 times a day for 3 days only. After administering the afrin, wait 5 minutes and then utilize the nasal saline spray.   You may use the nasal saline spray as much as you like throughout the day.  If your symptoms do not improve or they worsen in 1 week, please return   Go directly to the Emergency Department or call 911 if you have severe chest pain, shortness of breath, severe diarrhea or feel as though you might pass out.    If your Covid-19 test is positive, you will receive a phone call from [facility] regarding your results. Negative test results are not called. Both positive and negative results area always visible on MyChart. If you do not have a MyChart account, sign up instructions are in your discharge papers.   Persons who are directed to care for themselves at home may discontinue isolation under the following conditions:   At least 10 days have passed since symptom onset and  At least 24 hours have passed without running a fever (this means without the use of fever-reducing medications) and  Other symptoms have improved.  Persons infected with COVID-19 who never develop symptoms may discontinue isolation and other precautions 10 days after the date of their first positive COVID-19 test.

## 2019-03-20 NOTE — ED Triage Notes (Signed)
03/17/2019 symptoms started .  Patient had headache to right side of face, no taste, no sense of smell, stuffy nose

## 2019-03-22 LAB — NOVEL CORONAVIRUS, NAA (HOSP ORDER, SEND-OUT TO REF LAB; TAT 18-24 HRS): SARS-CoV-2, NAA: NOT DETECTED

## 2019-07-07 ENCOUNTER — Other Ambulatory Visit: Payer: Self-pay

## 2019-07-07 ENCOUNTER — Emergency Department (HOSPITAL_COMMUNITY): Payer: 59

## 2019-07-07 ENCOUNTER — Encounter (HOSPITAL_COMMUNITY): Payer: Self-pay | Admitting: Emergency Medicine

## 2019-07-07 ENCOUNTER — Emergency Department (HOSPITAL_COMMUNITY)
Admission: EM | Admit: 2019-07-07 | Discharge: 2019-07-08 | Disposition: A | Discharge status: Home / Self Care | Payer: 59 | Attending: Emergency Medicine | Admitting: Emergency Medicine

## 2019-07-07 DIAGNOSIS — F17210 Nicotine dependence, cigarettes, uncomplicated: Secondary | ICD-10-CM | POA: Diagnosis not present

## 2019-07-07 DIAGNOSIS — R072 Precordial pain: Secondary | ICD-10-CM | POA: Diagnosis not present

## 2019-07-07 DIAGNOSIS — F419 Anxiety disorder, unspecified: Secondary | ICD-10-CM | POA: Insufficient documentation

## 2019-07-07 DIAGNOSIS — R079 Chest pain, unspecified: Secondary | ICD-10-CM | POA: Diagnosis present

## 2019-07-07 LAB — CBC
HCT: 45.3 % (ref 39.0–52.0)
Hemoglobin: 15.1 g/dL (ref 13.0–17.0)
MCH: 32.8 pg (ref 26.0–34.0)
MCHC: 33.3 g/dL (ref 30.0–36.0)
MCV: 98.3 fL (ref 80.0–100.0)
Platelets: 229 10*3/uL (ref 150–400)
RBC: 4.61 MIL/uL (ref 4.22–5.81)
RDW: 12.4 % (ref 11.5–15.5)
WBC: 10.4 10*3/uL (ref 4.0–10.5)
nRBC: 0 % (ref 0.0–0.2)

## 2019-07-07 LAB — BASIC METABOLIC PANEL
Anion gap: 9 (ref 5–15)
BUN: 10 mg/dL (ref 6–20)
CO2: 20 mmol/L — ABNORMAL LOW (ref 22–32)
Calcium: 8.4 mg/dL — ABNORMAL LOW (ref 8.9–10.3)
Chloride: 103 mmol/L (ref 98–111)
Creatinine, Ser: 1.1 mg/dL (ref 0.61–1.24)
GFR calc Af Amer: >60 mL/min (ref >60)
GFR calc non Af Amer: >60 mL/min (ref >60)
Glucose, Bld: 134 mg/dL — ABNORMAL HIGH (ref 70–99)
Potassium: 3.7 mmol/L (ref 3.5–5.1)
Sodium: 132 mmol/L — ABNORMAL LOW (ref 135–145)

## 2019-07-07 LAB — TROPONIN I (HIGH SENSITIVITY): Troponin I (High Sensitivity): 3 ng/L (ref <18)

## 2019-07-07 MED ORDER — SODIUM CHLORIDE 0.9% FLUSH: 3.0000 mL | Freq: Once | INTRAVENOUS | Status: DC

## 2019-07-07 MED ORDER — LORAZEPAM 1 MG PO TABS
1.0000 mg | ORAL_TABLET | Freq: Once | ORAL | Status: AC
  Administered 2019-07-08: 1 mg via ORAL
  Filled 2019-07-07: qty 1

## 2019-07-07 NOTE — ED Triage Notes (Signed)
Pt c/o chest pain across upper chest to bilat axilla's x 3 days. Denies n/v, +shob.

## 2019-07-08 MED ORDER — LORAZEPAM 1 MG PO TABS: 1.0000 mg | ORAL_TABLET | Freq: Three times a day (TID) | ORAL | 0 refills | Status: AC | PRN

## 2019-07-08 NOTE — Discharge Instructions (Signed)

## 2019-07-08 NOTE — ED Provider Notes (Signed)
[facility] EMERGENCY DEPARTMENT Provider Note   CSN: [id number] Arrival date & time: 07/07/19  1802     History Chief Complaint  Patient presents with  . Chest Pain    Troy Barnett is a 44 y.o. male.  HPI  HPI: A 44 year old patient presents for evaluation of chest pain. Initial onset of pain was more than 6 hours ago. The patient's chest pain is described as heaviness/pressure/tightness, is sharp and is not worse with exertion. The patient's chest pain is middle- or left-sided, is not well-localized and does not radiate to the arms/jaw/neck. The patient does not complain of nausea and denies diaphoresis. The patient has smoked in the past 90 days and has a family history of coronary artery disease in a first-degree relative with onset less than age 26. The patient has no history of stroke, has no history of peripheral artery disease, denies any history of treated diabetes, is not hypertensive, has no history of hypercholesterolemia and does not have an elevated BMI (>=30).  Patient reports that the past week he has had frequent episodes of chest pain.  He said chest pain every day that will worsen at times.  Seems to be worse at night he feels short of breath.  He reports feeling very anxious.  No fevers or vomiting.  No diaphoresis.  No history of CAD/VTE.  He reports this all began about a week ago when his son was "locked up" in front of him. Since that time he had difficulty sleeping and keeps thinking about his son.  PMH-none Soc hx -smoker Patient Active Problem List   Diagnosis Date Noted  . Acute hepatitis 09/23/2014  . Transaminitis 09/23/2014  . Nausea and vomiting 09/23/2014    History reviewed. No pertinent surgical history.     No family history on file.  Social History   Tobacco Use  . Smoking status: Current Every Day Smoker    Packs/day: 0.50    Types: Cigarettes  . Smokeless tobacco: Never Used  Substance Use Topics  . Alcohol use: Yes     Alcohol/week: 1.0 standard drinks    Types: 1 Cans of beer per week    Comment: beer/day  . Drug use: No    Home Medications Prior to Admission medications   Medication Sig Start Date End Date Taking? Authorizing Provider  albuterol (VENTOLIN HFA) 108 (90 Base) MCG/ACT inhaler Inhale 1-2 puffs into the lungs every 6 (six) hours as needed for wheezing or shortness of breath. 10/12/18   Wallis Bamberg, PA-C  sodium chloride (OCEAN) 0.65 % SOLN nasal spray Place 2 sprays into both nostrils as needed for up to 15 days for congestion. 03/20/19 07/07/19  Darr, Veryl Speak, PA-C    Allergies    Patient has no known allergies.  Review of Systems   Review of Systems  Constitutional: Negative for fever.  Respiratory: Positive for shortness of breath.        Denies hemoptysis  Cardiovascular: Positive for chest pain.  Gastrointestinal: Negative for vomiting.  Psychiatric/Behavioral: Positive for sleep disturbance. Negative for suicidal ideas. The patient is nervous/anxious.   All other systems reviewed and are negative.   Physical Exam Updated Vital Signs BP 135/89 (BP Location: Right Arm)   Pulse 62   Temp 98.4 F (36.9 C) (Oral)   Resp 18   Ht 1.727 m (5\' 8" )   Wt 66.7 kg   SpO2 100%   BMI 22.35 kg/m   Physical Exam CONSTITUTIONAL: Well developed/well nourished  HEAD: Normocephalic/atraumatic EYES: EOMI/PERRL ENMT: Mucous membranes moist NECK: supple no meningeal signs SPINE/BACK:entire spine nontender CV: S1/S2 noted, no murmurs/rubs/gallops noted LUNGS: Lungs are clear to auscultation bilaterally, no apparent distress ABDOMEN: soft, nontender, no rebound or guarding, bowel sounds noted throughout abdomen GU:no cva tenderness NEURO: Pt is awake/alert/appropriate, moves all extremitiesx4.  No facial droop.  EXTREMITIES: pulses normal/equal, full ROM, no lower extremity or tenderness SKIN: warm, color normal PSYCH: Mildly anxious  ED Results / Procedures / Treatments    Labs (all labs ordered are listed, but only abnormal results are displayed) Labs Reviewed  BASIC METABOLIC PANEL - Abnormal; Notable for the following components:      Result Value   Sodium 132 (*)    CO2 20 (*)    Glucose, Bld 134 (*)    Calcium 8.4 (*)    All other components within normal limits  CBC  TROPONIN I (HIGH SENSITIVITY)    EKG EKG Interpretation  Date/Time:  Sunday July 07 2019 18:07:14 EDT Ventricular Rate:  90 PR Interval:  158 QRS Duration: 102 QT Interval:  352 QTC Calculation: 430 R Axis:   90 Text Interpretation: Normal sinus rhythm with sinus arrhythmia Rightward axis Borderline ECG Confirmed by Sophiagrace Benbrook ([id number]) on 07/07/2019 11:44:08 PM   Radiology DG Chest 2 View  Result Date: 07/07/2019 CLINICAL DATA:  Chest pain, upper chest a bilateral axilla for 3 days EXAM: CHEST - 2 VIEW COMPARISON:  June 12, 2017 FINDINGS: Cardiomediastinal contours and hilar structures are normal. Lungs are clear. No evidence of pleural effusion. Visualized skeletal structures are unremarkable. IMPRESSION: No acute cardiopulmonary disease. Electronically Signed   By: Geoffrey  Wile M.D.   On: 07/07/2019 19:01    Procedures Procedures  Medications Ordered in ED Medications  sodium chloride flush (NS) 0.9 % injection 3 mL (has no administration in time range)  LORazepam (ATIVAN) tablet 1 mg (1 mg Oral Given 07/08/19 0020)    ED Course  I have reviewed the triage vital signs and the nursing notes.  Pertinent labs & imaging results that were available during my care of the patient were reviewed by me and considered in my medical decision making (see chart for details).    MDM Rules/Calculators/A&P HEAR Score: 1                    12 :10 AM Patient presents for chest pain.  It has been going on for the past week and seem to start after his son was arrested.  Patient reports feeling very anxious and decreased sleep.  He feels there is a strong anxiety component and  I agree. He is low risk for ACS/PE.  He appears PERC negative 1:07 AM Patient improved.  No distress.  He feels comfortable for discharge.  Short course of Ativan ordered.  Will follow up with PCP   This patient presents to the ED for concern of chest pain, this involves an extensive number of treatment options, and is a complaint that carries with it a high risk of complications and morbidity.  The differential diagnosis includes pulmonary embolism, ACS, aortic dissection, pericarditis   Lab Tests:   I Ordered, reviewed, and interpreted labs, which included troponin, metabolic panel, complete blood count  Medicines ordered:   I ordered medication Ativan for anxiety  Imaging Studies ordered:   I ordered imaging studies which included chest x-[place]   I independently visualized and interpreted imaging which showed no acute findings    Reevaluation:  After the  interventions stated above, I reevaluated the patient and found patient is improved  Final Clinical Impression(s) / ED Diagnoses Final diagnoses:  Precordial pain  Anxiety    Rx / DC Orders ED Discharge Orders         Ordered    LORazepam (ATIVAN) 1 MG tablet  Every 8 hours PRN     07/08/19 0013           Zadie Rhine, MD 07/08/19 [phone]

## 2019-11-19 ENCOUNTER — Other Ambulatory Visit: Payer: Self-pay | Admitting: Critical Care Medicine

## 2019-11-19 ENCOUNTER — Other Ambulatory Visit: Payer: Self-pay

## 2019-11-19 DIAGNOSIS — Z20822 Contact with and (suspected) exposure to covid-19: Secondary | ICD-10-CM

## 2019-11-20 LAB — NOVEL CORONAVIRUS, NAA: SARS-CoV-2, NAA: NOT DETECTED

## 2019-11-20 LAB — SARS-COV-2, NAA 2 DAY TAT

## 2020-04-06 NOTE — Progress Notes (Signed)
Subjective:    Patient ID: Troy Barnett, male    DOB: 01/27/1976, 45 y.o.   MRN: [id number] Virtual Visit via Telephone Note  I connected with Laury Axon on 04/07/20 at  9:30 AM EST by telephone and verified that I am speaking with the correct person using two identifiers.   Consent:  I discussed the limitations, risks, security and privacy concerns of performing an evaluation and management service by telephone and the availability of in person appointments. I also discussed with the patient that there may be a patient responsible charge related to this service. The patient expressed understanding and agreed to proceed.  Location of patient: Patient is at home  Location of provider: I am in my office  Persons participating in the televisit with the patient.   No one else on the call    History of Present Illness:  45 y.o.M here to est PCP  04/07/20:  Telehealth OV:   This patient is here to establish for primary care by way of a telehealth visit.  This patient unfortunately has had children who developed COVID infections on January 6 and may have actually been ill as early as the [place] weekend.  The patient was exposed to them and he began developing symptoms on January 10 with loss of taste and smell some shortness of breath minimal cough.  Patient's been on albuterol in the past.  He has no other specific chronic medical problems.  He wishes to establish care and chronic.  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  He is on no chronic medical medications  He has no other complaints at this time    History reviewed. No pertinent past medical history.   History reviewed. No pertinent family history.   Social History   Socioeconomic History  . Marital status: Significant Other    Spouse name: Not on file  . Number of children: Not on file  . Years of education: Not on file  . Highest education level: Not on file  Occupational History  . Not on file  Tobacco Use  .  Smoking status: Former Smoker    Packs/day: 0.50    Types: Cigarettes    Quit date: 04/07/2020  . Smokeless tobacco: Never Used  Substance and Sexual Activity  . Alcohol use: Yes    Alcohol/week: 1.0 standard drink    Types: 1 Cans of beer per week    Comment: beer/day  . Drug use: No  . Sexual activity: Yes  Other Topics Concern  . Not on file  Social History Narrative  . Not on file   Social Determinants of Health   Financial Resource Strain: Not on file  Food Insecurity: Not on file  Transportation Needs: Not on file  Physical Activity: Not on file  Stress: Not on file  Social Connections: Not on file  Intimate Partner Violence: Not on file     No Known Allergies   Outpatient Medications Prior to Visit  Medication Sig Dispense Refill  . albuterol (VENTOLIN HFA) 108 (90 Base) MCG/ACT inhaler Inhale 1-2 puffs into the lungs every 6 (six) hours as needed for wheezing or shortness of breath. (Patient not taking: Reported on 04/07/2020) 18 g 0  . LORazepam (ATIVAN) 1 MG tablet Take 1 tablet (1 mg total) by mouth every 8 (eight) hours as needed for anxiety. (Patient not taking: Reported on 04/07/2020) 10 tablet 0   No facility-administered medications prior to visit.  History reviewed. No pertinent past medical history.   History reviewed. No pertinent family history.   Social History   Socioeconomic History  . Marital status: Significant Other    Spouse name: Not on file  . Number of children: Not on file  . Years of education: Not on file  . Highest education level: Not on file  Occupational History  . Not on file  Tobacco Use  . Smoking status: Current Every Day Smoker    Packs/day: 0.50    Types: Cigarettes  . Smokeless tobacco: Never Used  Substance and Sexual Activity  . Alcohol use: Yes    Alcohol/week: 1.0 standard drink    Types: 1 Cans of beer per week    Comment: beer/day  . Drug use: No  . Sexual activity: Yes  Other Topics Concern  . Not  on file  Social History Narrative  . Not on file   Social Determinants of Health   Financial Resource Strain: Not on file  Food Insecurity: Not on file  Transportation Needs: Not on file  Physical Activity: Not on file  Stress: Not on file  Social Connections: Not on file  Intimate Partner Violence: Not on file     No Known Allergies   Outpatient Medications Prior to Visit  Medication Sig Dispense Refill  . albuterol (VENTOLIN HFA) 108 (90 Base) MCG/ACT inhaler Inhale 1-2 puffs into the lungs every 6 (six) hours as needed for wheezing or shortness of breath. 18 g 0  . LORazepam (ATIVAN) 1 MG tablet Take 1 tablet (1 mg total) by mouth every 8 (eight) hours as needed for anxiety. 10 tablet 0   No facility-administered medications prior to visit.    Review of Systems  HENT:       Nose is sore and dry  Respiratory: Positive for cough, chest tightness and shortness of breath. Negative for wheezing.   Cardiovascular: Negative for chest pain and leg swelling.  Gastrointestinal: Positive for anal bleeding. Negative for abdominal pain, constipation, diarrhea, nausea and vomiting.  Neurological: Positive for headaches. Negative for dizziness, weakness and numbness.  Hematological: Negative for adenopathy.  Psychiatric/Behavioral: Negative for behavioral problems, confusion and dysphoric mood. The patient is not nervous/anxious.        Objective:   Physical Exam No exam this is a phone visit       Assessment & Plan:  I personally reviewed all images and lab data in the [facility] system as well as any outside material available during this office visit and agree with the  radiology impressions.   No problem-specific Assessment & Plan notes found for this encounter.   Diagnoses and all orders for this visit:  Encounter for screening for COVID-19 -     Novel Coronavirus, NAA ([facility])      Follow Up Instructions: Patient knows to come today for COVID test and will be seen in  person late January for physical exam   I discussed the assessment and treatment plan with the patient. The patient was provided an opportunity to ask questions and all were answered. The patient agreed with the plan and demonstrated an understanding of the instructions.   The patient was advised to call back or seek an in-person evaluation if the symptoms worsen or if the condition fails to improve as anticipated.  I provided 30 minutes of non-face-to-face time during this encounter  including  median intraservice time , review of notes, labs, imaging, medications  and explaining diagnosis and management to the  patient .    Shan Levans, MD

## 2020-04-07 ENCOUNTER — Other Ambulatory Visit: Payer: Self-pay

## 2020-04-07 ENCOUNTER — Ambulatory Visit: Payer: Self-pay | Attending: Critical Care Medicine | Admitting: Critical Care Medicine

## 2020-04-07 ENCOUNTER — Encounter: Payer: Self-pay | Admitting: Critical Care Medicine

## 2020-04-07 DIAGNOSIS — Z20822 Contact with and (suspected) exposure to covid-19: Secondary | ICD-10-CM

## 2020-04-07 DIAGNOSIS — Z1152 Encounter for screening for COVID-19: Secondary | ICD-10-CM | POA: Insufficient documentation

## 2020-04-07 NOTE — Assessment & Plan Note (Signed)
Exposure to COVID 19 infection in his children outpatient symptomatic  I sent the patient to a testing center today to have his COVID test  Patient was given supportive measures including to take vitamins daily vitamin C D and zinc  We will schedule this patient for face-to-face physical exam in a month

## 2020-04-07 NOTE — Patient Instructions (Signed)
Please obtain your COVID test I will follow-up results  Please take vitamin C 500 mg daily vitamin D 1000 to 2000 units daily and zinc 50 mg daily  Please keep yourself hydrated  We will schedule a face-to-face visit with you in 1 month  Once we know the result of your COVID result we will indicate to you your isolation time for now would expect to stay quarantined at home until a minimum of January 19 when you could return to work

## 2020-04-08 ENCOUNTER — Encounter: Payer: Self-pay | Admitting: Critical Care Medicine

## 2020-04-08 ENCOUNTER — Telehealth (INDEPENDENT_AMBULATORY_CARE_PROVIDER_SITE_OTHER): Payer: Self-pay

## 2020-04-08 NOTE — Telephone Encounter (Signed)
I have printed this letter and signed.

## 2020-04-08 NOTE — Telephone Encounter (Signed)
Copied from CRM [id number]. Topic: General - Other >> Apr 07, 2020  1:42 PM Dalphine Handing A wrote: Patient called to let Dr. Delford Field know to upload work note for his employer to his mychart account.

## 2020-04-09 ENCOUNTER — Other Ambulatory Visit: Payer: Self-pay

## 2020-06-22 ENCOUNTER — Emergency Department (HOSPITAL_COMMUNITY)
Admission: EM | Admit: 2020-06-22 | Discharge: 2020-06-22 | Disposition: A | Discharge status: Home / Self Care | Payer: Self-pay | Attending: Emergency Medicine | Admitting: Emergency Medicine

## 2020-06-22 ENCOUNTER — Encounter (HOSPITAL_COMMUNITY): Payer: Self-pay

## 2020-06-22 ENCOUNTER — Emergency Department (HOSPITAL_COMMUNITY): Payer: Self-pay

## 2020-06-22 ENCOUNTER — Other Ambulatory Visit: Payer: Self-pay

## 2020-06-22 DIAGNOSIS — R0789 Other chest pain: Secondary | ICD-10-CM

## 2020-06-22 DIAGNOSIS — Z87891 Personal history of nicotine dependence: Secondary | ICD-10-CM | POA: Insufficient documentation

## 2020-06-22 DIAGNOSIS — R0602 Shortness of breath: Secondary | ICD-10-CM | POA: Insufficient documentation

## 2020-06-22 LAB — BASIC METABOLIC PANEL
Anion gap: 4 — ABNORMAL LOW (ref 5–15)
BUN: 9 mg/dL (ref 6–20)
CO2: 28 mmol/L (ref 22–32)
Calcium: 9.1 mg/dL (ref 8.9–10.3)
Chloride: 106 mmol/L (ref 98–111)
Creatinine, Ser: 1.09 mg/dL (ref 0.61–1.24)
GFR, Estimated: >60 mL/min (ref >60)
Glucose, Bld: 90 mg/dL (ref 70–99)
Potassium: 4.2 mmol/L (ref 3.5–5.1)
Sodium: 138 mmol/L (ref 135–145)

## 2020-06-22 LAB — D-DIMER, QUANTITATIVE: D-Dimer, Quant: <0.27 ug/mL-FEU (ref 0.00–0.50)

## 2020-06-22 LAB — CBC
HCT: 42.2 % (ref 39.0–52.0)
Hemoglobin: 14.6 g/dL (ref 13.0–17.0)
MCH: 34.2 pg — ABNORMAL HIGH (ref 26.0–34.0)
MCHC: 34.6 g/dL (ref 30.0–36.0)
MCV: 98.8 fL (ref 80.0–100.0)
Platelets: 227 10*3/uL (ref 150–400)
RBC: 4.27 MIL/uL (ref 4.22–5.81)
RDW: 12.9 % (ref 11.5–15.5)
WBC: 9.1 10*3/uL (ref 4.0–10.5)
nRBC: 0 % (ref 0.0–0.2)

## 2020-06-22 LAB — TROPONIN I (HIGH SENSITIVITY)
Troponin I (High Sensitivity): 6 ng/L (ref <18)
Troponin I (High Sensitivity): 5 ng/L (ref <18)

## 2020-06-22 MED ORDER — OXYCODONE HCL 5 MG PO TABS
5.0000 mg | ORAL_TABLET | Freq: Once | ORAL | Status: AC
  Administered 2020-06-22: 5 mg via ORAL
  Filled 2020-06-22: qty 1

## 2020-06-22 MED ORDER — KETOROLAC TROMETHAMINE 15 MG/ML IJ SOLN
15.0000 mg | Freq: Once | INTRAMUSCULAR | Status: AC
  Administered 2020-06-22: 15 mg via INTRAMUSCULAR
  Filled 2020-06-22: qty 1

## 2020-06-22 NOTE — ED Provider Notes (Signed)
[facility] EMERGENCY DEPARTMENT Provider Note   CSN: [id number] Arrival date & time: 06/22/20  0023     History Chief Complaint  Patient presents with  . Shortness of Breath    Troy Barnett is a 45 y.o. male.  The history is provided by the patient.  Shortness of Breath Severity:  Moderate Onset quality:  Gradual Timing:  Intermittent Progression:  Waxing and waning Chronicity:  New Context comment:  Pain with breathing and movement in the chest wall Relieved by:  Nothing Worsened by:  Nothing Associated symptoms: no abdominal pain, no chest pain, no claudication, no cough, no diaphoresis, no ear pain, no fever, no headaches, no hemoptysis, no neck pain, no PND, no rash, no sore throat, no sputum production, no syncope, no swollen glands, no vomiting and no wheezing   Leg Pain Associated symptoms: no back pain, no fever and no neck pain        History reviewed. No pertinent past medical history.  Patient Active Problem List   Diagnosis Date Noted  . Encounter for screening for COVID-19 04/07/2020    History reviewed. No pertinent surgical history.     History reviewed. No pertinent family history.  Social History   Tobacco Use  . Smoking status: Former Smoker    Packs/day: 0.50    Types: Cigarettes    Quit date: 04/07/2020    Years since quitting: 0.2  . Smokeless tobacco: Never Used  Substance Use Topics  . Alcohol use: Yes    Alcohol/week: 1.0 standard drink    Types: 1 Cans of beer per week    Comment: beer/day  . Drug use: No    Home Medications Prior to Admission medications   Medication Sig Start Date End Date Taking? Authorizing Provider  albuterol (VENTOLIN HFA) 108 (90 Base) MCG/ACT inhaler Inhale 1-2 puffs into the lungs every 6 (six) hours as needed for wheezing or shortness of breath. Patient not taking: Reported on 04/07/2020 10/12/18   Wallis Bamberg, PA-C  LORazepam (ATIVAN) 1 MG tablet Take 1 tablet (1 mg total) by mouth  every 8 (eight) hours as needed for anxiety. Patient not taking: Reported on 04/07/2020 07/08/19   Zadie Rhine, MD  sodium chloride (OCEAN) 0.65 % SOLN nasal spray Place 2 sprays into both nostrils as needed for up to 15 days for congestion. 03/20/19 07/07/19  Darr, Gerilyn Pilgrim, PA-C    Allergies    Patient has no known allergies.  Review of Systems   Review of Systems  Constitutional: Negative for chills, diaphoresis and fever.  HENT: Negative for ear pain and sore throat.   Eyes: Negative for pain and visual disturbance.  Respiratory: Positive for shortness of breath. Negative for cough, hemoptysis, sputum production and wheezing.   Cardiovascular: Negative for chest pain, palpitations, claudication, syncope and PND.  Gastrointestinal: Negative for abdominal pain and vomiting.  Genitourinary: Negative for dysuria and hematuria.  Musculoskeletal: Negative for arthralgias, back pain and neck pain.  Skin: Negative for color change and rash.  Neurological: Negative for seizures, syncope and headaches.  All other systems reviewed and are negative.   Physical Exam Updated Vital Signs  ED Triage Vitals  Enc Vitals Group     BP 06/22/20 0131 (!) 151/101     Pulse Rate 06/22/20 0131 71     Resp 06/22/20 0131 19     Temp 06/22/20 0131 98.2 F (36.8 C)     Temp Source 06/22/20 0131 Oral     SpO2 06/22/20  0131 98 %     Weight 06/22/20 0220 145 lb (65.8 kg)     Height 06/22/20 0220 5\' 9"  (1.753 m)     Head Circumference --      Peak Flow --      Pain Score 06/22/20 0220 6     Pain Loc --      Pain Edu? --      Excl. in GC? --     Physical Exam Vitals and nursing note reviewed.  Constitutional:      General: He is not in acute distress.    Appearance: He is well-developed. He is not ill-appearing.  HENT:     Head: Normocephalic and atraumatic.     Mouth/Throat:     Mouth: Mucous membranes are moist.  Eyes:     Conjunctiva/sclera: Conjunctivae normal.     Pupils: Pupils are  equal, round, and reactive to light.  Cardiovascular:     Rate and Rhythm: Normal rate and regular rhythm.     Pulses: Normal pulses.     Heart sounds: Normal heart sounds. No murmur heard.   Pulmonary:     Effort: Pulmonary effort is normal. No respiratory distress.     Breath sounds: Normal breath sounds. No decreased breath sounds, wheezing, rhonchi or rales.  Chest:     Chest wall: Tenderness present.  Abdominal:     Palpations: Abdomen is soft.     Tenderness: There is no abdominal tenderness.  Musculoskeletal:     Cervical back: Normal range of motion and neck supple.     Right lower leg: No edema.     Left lower leg: No edema.  Skin:    General: Skin is warm and dry.     Capillary Refill: Capillary refill takes less than 2 seconds.  Neurological:     General: No focal deficit present.     Mental Status: He is alert.     ED Results / Procedures / Treatments   Labs (all labs ordered are listed, but only abnormal results are displayed) Labs Reviewed  BASIC METABOLIC PANEL - Abnormal; Notable for the following components:      Result Value   Anion gap 4 (*)    All other components within normal limits  CBC - Abnormal; Notable for the following components:   MCH 34.2 (*)    All other components within normal limits  D-DIMER, QUANTITATIVE  TROPONIN I (HIGH SENSITIVITY)  TROPONIN I (HIGH SENSITIVITY)    EKG EKG Interpretation  Date/Time:  Monday June 22 2020 01:40:06 EDT Ventricular Rate:  66 PR Interval:  172 QRS Duration: 86 QT Interval:  400 QTC Calculation: 419 R Axis:   89 Text Interpretation: Normal sinus rhythm with sinus arrhythmia Normal ECG When compared with ECG of 07/07/2019, No significant change was found Confirmed by 09/06/2019 (Dione Booze) on 06/22/2020 2:07:35 AM   Radiology DG Chest 2 View  Result Date: 06/22/2020 CLINICAL DATA:  Three days of shortness of breath and chest pain. EXAM: CHEST - 2 VIEW COMPARISON:  July 07, 2019 FINDINGS: The  heart size and mediastinal contours are within normal limits. No focal consolidation. No pleural effusion. No pneumothorax. The visualized skeletal structures are unremarkable. IMPRESSION: No active cardiopulmonary disease. Electronically Signed   By: July 09, 2019 MD   On: 06/22/2020 02:36    Procedures Procedures   Medications Ordered in ED Medications  ketorolac (TORADOL) 15 MG/ML injection 15 mg (has no administration in time range)  oxyCODONE (Oxy IR/ROXICODONE)  immediate release tablet 5 mg (5 mg Oral Given 06/22/20 0915)    ED Course  I have reviewed the triage vital signs and the nursing notes.  Pertinent labs & imaging results that were available during my care of the patient were reviewed by me and considered in my medical decision making (see chart for details).    MDM Rules/Calculators/A&P                          Troy Barnett is here with shortness of breath and chest pain when he takes a deep breath and when he moves.  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  Appears to have some sort of reproducible pain in his chest wall.  But could be a PE.  Will get a D-dimer.  EKG and troponin have already been done and unremarkable.  No ischemic changes on EKG.  No pneumothorax, no pneumonia on chest x-[place].  Will get second troponin and D-dimer and consider PE scan.  Patient otherwise with no significant anemia, electrolyte abnormality, kidney injury.  Troponins negative.  Doubt ACS.  Atypical story.  Seems muscular in nature.  D-dimer negative and doubt PE.  Felt better after Toradol and oxycodone.  Recommend continued use of Tylenol Motrin.  Discharged in good condition.  This chart was dictated using voice recognition software.  Despite best efforts to proofread,  errors can occur which can change the documentation meaning.    Final Clinical Impression(s) / ED Diagnoses Final diagnoses:  Atypical chest pain    Rx / DC Orders ED Discharge Orders    None       Virgina Norfolk,  DO 06/22/20 1046

## 2020-06-22 NOTE — Discharge Instructions (Signed)
Suspect you have muscular pain.  Take Tylenol and Motrin.  Follow-up with primary care doctor.

## 2020-06-22 NOTE — ED Triage Notes (Signed)
Pt reports that he is having SOB with a pinching pain on the left side of chest.  Started 3 days ago. Denies nausea/vomiting.

## 2021-05-03 ENCOUNTER — Other Ambulatory Visit: Payer: Self-pay

## 2021-05-03 ENCOUNTER — Emergency Department (HOSPITAL_COMMUNITY)
Admission: EM | Admit: 2021-05-03 | Discharge: 2021-05-03 | Disposition: A | Discharge status: Home / Self Care | Payer: Self-pay | Attending: Emergency Medicine | Admitting: Emergency Medicine

## 2021-05-03 ENCOUNTER — Emergency Department (HOSPITAL_COMMUNITY): Payer: Self-pay

## 2021-05-03 ENCOUNTER — Encounter (HOSPITAL_COMMUNITY): Payer: Self-pay

## 2021-05-03 DIAGNOSIS — Z87891 Personal history of nicotine dependence: Secondary | ICD-10-CM | POA: Insufficient documentation

## 2021-05-03 DIAGNOSIS — R079 Chest pain, unspecified: Secondary | ICD-10-CM | POA: Insufficient documentation

## 2021-05-03 LAB — TROPONIN I (HIGH SENSITIVITY)
Troponin I (High Sensitivity): 4 ng/L (ref <18)
Troponin I (High Sensitivity): 4 ng/L (ref <18)

## 2021-05-03 LAB — BASIC METABOLIC PANEL
Anion gap: 9 (ref 5–15)
BUN: 10 mg/dL (ref 6–20)
CO2: 23 mmol/L (ref 22–32)
Calcium: 9.1 mg/dL (ref 8.9–10.3)
Chloride: 105 mmol/L (ref 98–111)
Creatinine, Ser: 1.15 mg/dL (ref 0.61–1.24)
GFR, Estimated: >60 mL/min (ref >60)
Glucose, Bld: 156 mg/dL — ABNORMAL HIGH (ref 70–99)
Potassium: 3.7 mmol/L (ref 3.5–5.1)
Sodium: 137 mmol/L (ref 135–145)

## 2021-05-03 LAB — LIPASE, BLOOD: Lipase: 30 U/L (ref 11–51)

## 2021-05-03 LAB — HEPATIC FUNCTION PANEL
ALT: 10 U/L (ref 0–44)
AST: 18 U/L (ref 15–41)
Albumin: 3.7 g/dL (ref 3.5–5.0)
Alkaline Phosphatase: 47 U/L (ref 38–126)
Bilirubin, Direct: 0.1 mg/dL (ref 0.0–0.2)
Indirect Bilirubin: 0.7 mg/dL (ref 0.3–0.9)
Total Bilirubin: 0.8 mg/dL (ref 0.3–1.2)
Total Protein: 6.8 g/dL (ref 6.5–8.1)

## 2021-05-03 LAB — CBC
HCT: 46.5 % (ref 39.0–52.0)
Hemoglobin: 16 g/dL (ref 13.0–17.0)
MCH: 33.3 pg (ref 26.0–34.0)
MCHC: 34.4 g/dL (ref 30.0–36.0)
MCV: 96.9 fL (ref 80.0–100.0)
Platelets: 247 10*3/uL (ref 150–400)
RBC: 4.8 MIL/uL (ref 4.22–5.81)
RDW: 12.2 % (ref 11.5–15.5)
WBC: 8.2 10*3/uL (ref 4.0–10.5)
nRBC: 0 % (ref 0.0–0.2)

## 2021-05-03 NOTE — Discharge Instructions (Signed)
Follow-up with a primary care doctor to be rechecked and to discuss further testing. Return as needed for recurrent or worsening symptoms

## 2021-05-03 NOTE — ED Provider Notes (Signed)
[facility] EMERGENCY DEPARTMENT Provider Note   CSN: [id number] Arrival date & time: 05/03/21  1443     History  Chief Complaint  Patient presents with   Chest Pain    Troy Barnett is a 46 y.o. male.   Chest Pain Associated symptoms: no fever and no shortness of breath    HPI: A 46 year old patient presents for evaluation of chest pain. Initial onset of pain was more than 6 hours ago. The patient's chest pain is sharp and is not worse with exertion. The patient's chest pain is middle- or left-sided, is not well-localized, is not described as heaviness/pressure/tightness and does not radiate to the arms/jaw/neck. The patient does not complain of nausea and denies diaphoresis. The patient has smoked in the past 90 days and has a family history of coronary artery disease in a first-degree relative with onset less than age 36. The patient has no history of stroke, has no history of peripheral artery disease, denies any history of treated diabetes, is not hypertensive, has no history of hypercholesterolemia and does not have an elevated BMI (>=30).   Home Medications Prior to Admission medications   Medication Sig Start Date End Date Taking? Authorizing Provider  albuterol (VENTOLIN HFA) 108 (90 Base) MCG/ACT inhaler Inhale 1-2 puffs into the lungs every 6 (six) hours as needed for wheezing or shortness of breath. Patient not taking: Reported on 04/07/2020 10/12/18   Wallis Bamberg, PA-C  LORazepam (ATIVAN) 1 MG tablet Take 1 tablet (1 mg total) by mouth every 8 (eight) hours as needed for anxiety. Patient not taking: Reported on 04/07/2020 07/08/19   Zadie Rhine, MD  sodium chloride (OCEAN) 0.65 % SOLN nasal spray Place 2 sprays into both nostrils as needed for up to 15 days for congestion. 03/20/19 07/07/19  Darr, Gerilyn Pilgrim, PA-C      Allergies    Patient has no known allergies.    Review of Systems   Review of Systems  Constitutional:  Negative for fever.  Respiratory:   Negative for shortness of breath.   Cardiovascular:  Positive for chest pain.   Physical Exam Updated Vital Signs BP 130/78    Pulse 66    Temp 98.1 F (36.7 C) (Oral)    Resp 12    Ht 1.753 m (5\' 9" )    Wt 66 kg    SpO2 100%    BMI 21.49 kg/m  Physical Exam Vitals and nursing note reviewed.  Constitutional:      General: He is not in acute distress.    Appearance: He is well-developed.  HENT:     Head: Normocephalic and atraumatic.     Right Ear: External ear normal.     Left Ear: External ear normal.  Eyes:     General: No scleral icterus.       Right eye: No discharge.        Left eye: No discharge.     Conjunctiva/sclera: Conjunctivae normal.  Neck:     Trachea: No tracheal deviation.  Cardiovascular:     Rate and Rhythm: Normal rate and regular rhythm.  Pulmonary:     Effort: Pulmonary effort is normal. No respiratory distress.     Breath sounds: Normal breath sounds. No stridor. No wheezing or rales.  Abdominal:     General: Bowel sounds are normal. There is no distension.     Palpations: Abdomen is soft.     Tenderness: There is no abdominal tenderness. There is no guarding or  rebound.  Musculoskeletal:        General: No tenderness or deformity.     Cervical back: Neck supple.  Skin:    General: Skin is warm and dry.     Findings: No rash.  Neurological:     General: No focal deficit present.     Mental Status: He is alert.     Cranial Nerves: No cranial nerve deficit (no facial droop, extraocular movements intact, no slurred speech).     Sensory: No sensory deficit.     Motor: No abnormal muscle tone or seizure activity.     Coordination: Coordination normal.  Psychiatric:        Mood and Affect: Mood normal.    ED Results / Procedures / Treatments   Labs (all labs ordered are listed, but only abnormal results are displayed) Labs Reviewed  BASIC METABOLIC PANEL - Abnormal; Notable for the following components:      Result Value   Glucose, Bld 156 (*)     All other components within normal limits  CBC  HEPATIC FUNCTION PANEL  LIPASE, BLOOD  TROPONIN I (HIGH SENSITIVITY)  TROPONIN I (HIGH SENSITIVITY)    EKG EKG Interpretation  Date/Time:  Monday May 03 2021 14:52:31 EST Ventricular Rate:  95 PR Interval:  138 QRS Duration: 84 QT Interval:  340 QTC Calculation: 427 R Axis:   91 Text Interpretation: Normal sinus rhythm with sinus arrhythmia Right atrial enlargement Rightward axis Pulmonary disease pattern Minimal voltage criteria for LVH, may be normal variant ( Cornell product ) Abnormal ECG When compared with ECG of 22-Jun-2020 01:40, lvh changes are new Confirmed by Linwood Dibbles [phone]) on 05/03/2021 7:21:38 PM  Radiology DG Chest 2 View  Result Date: 05/03/2021 CLINICAL DATA:  Chest pain.  Left-sided chest pain since last night. EXAM: CHEST - 2 VIEW COMPARISON:  06/22/2020 FINDINGS: The heart size and mediastinal contours are within normal limits. Both lungs are clear. Mild biapical pleural thickening is unchanged. The visualized skeletal structures are unremarkable. IMPRESSION: No active cardiopulmonary disease. Electronically Signed   By: Neita Garnet M.D.   On: 05/03/2021 15:33    Procedures Procedures    Medications Ordered in ED Medications - No data to display  ED Course/ Medical Decision Making/ A&P   HEAR Score: 2                       Medical Decision Making  Chest pain Low risk heart score.  Serial troponins are normal.  Doubt acute coronary syndrome Patient without shortness of breath.  Low risk for PE.  PERC negative No signs of pneumonia. Patient has mentioned some increased stress.  Is possible that could be related to his symptoms.  Evaluation and diagnostic testing in the emergency department does not suggest an emergent condition requiring admission or immediate intervention beyond what has been performed at this time.  The patient is safe for discharge and has been instructed to return immediately for  worsening symptoms, change in symptoms or any other concerns.         Final Clinical Impression(s) / ED Diagnoses Final diagnoses:  Chest pain, unspecified type    Rx / DC Orders ED Discharge Orders     None         Linwood Dibbles, MD 05/03/21 2228

## 2021-05-03 NOTE — ED Provider Triage Note (Signed)
Emergency Medicine Provider Triage Evaluation Note  KYEN TAITE , a 46 y.o. male  was evaluated in triage.  Pt complains of left side chest pain that started last night night.  The pain comes and goes.  No history of similar.   Pain lasts for about 30-40 seconds. Happens about 3 times every hour or two.    No change with eating or drinking.    Feels short of breath when pain hits, feels hot, and flushed.  No vomiting.   Physical Exam  BP (!) 153/103 (BP Location: Left Arm)    Pulse (!) 103    Ht 5\' 9"  (1.753 m)    Wt 66 kg    SpO2 99%    BMI 21.49 kg/m  Gen:   Awake, no distress   Resp:  Normal effort  MSK:   Moves extremities without difficulty  Other:  Normal speech.  Answers questions with out difficulty.   Medical Decision Making  Medically screening exam initiated at 3:11 PM.  Appropriate orders placed.  DARELLE KINGS was informed that the remainder of the evaluation will be completed by another provider, this initial triage assessment does not replace that evaluation, and the importance of remaining in the ED until their evaluation is complete.     Laury Axon, Cristina Gong 05/03/21 1516

## 2021-05-03 NOTE — ED Triage Notes (Signed)
Pt arrives POV for eval of L sided CP since last night. Pt report intermittent in nature, sharp/squeezing. Pt reports pain is worse w/ breathing. Denies SOB

## 2021-06-05 IMAGING — DX DG CHEST 2V
2 series · 2 of 2 positions shown · non-contrast
Comparison: June 12, 2017

CLINICAL DATA: Chest pain, upper chest a bilateral axilla for 3
days

EXAM:
CHEST - 2 VIEW

[chest pa]
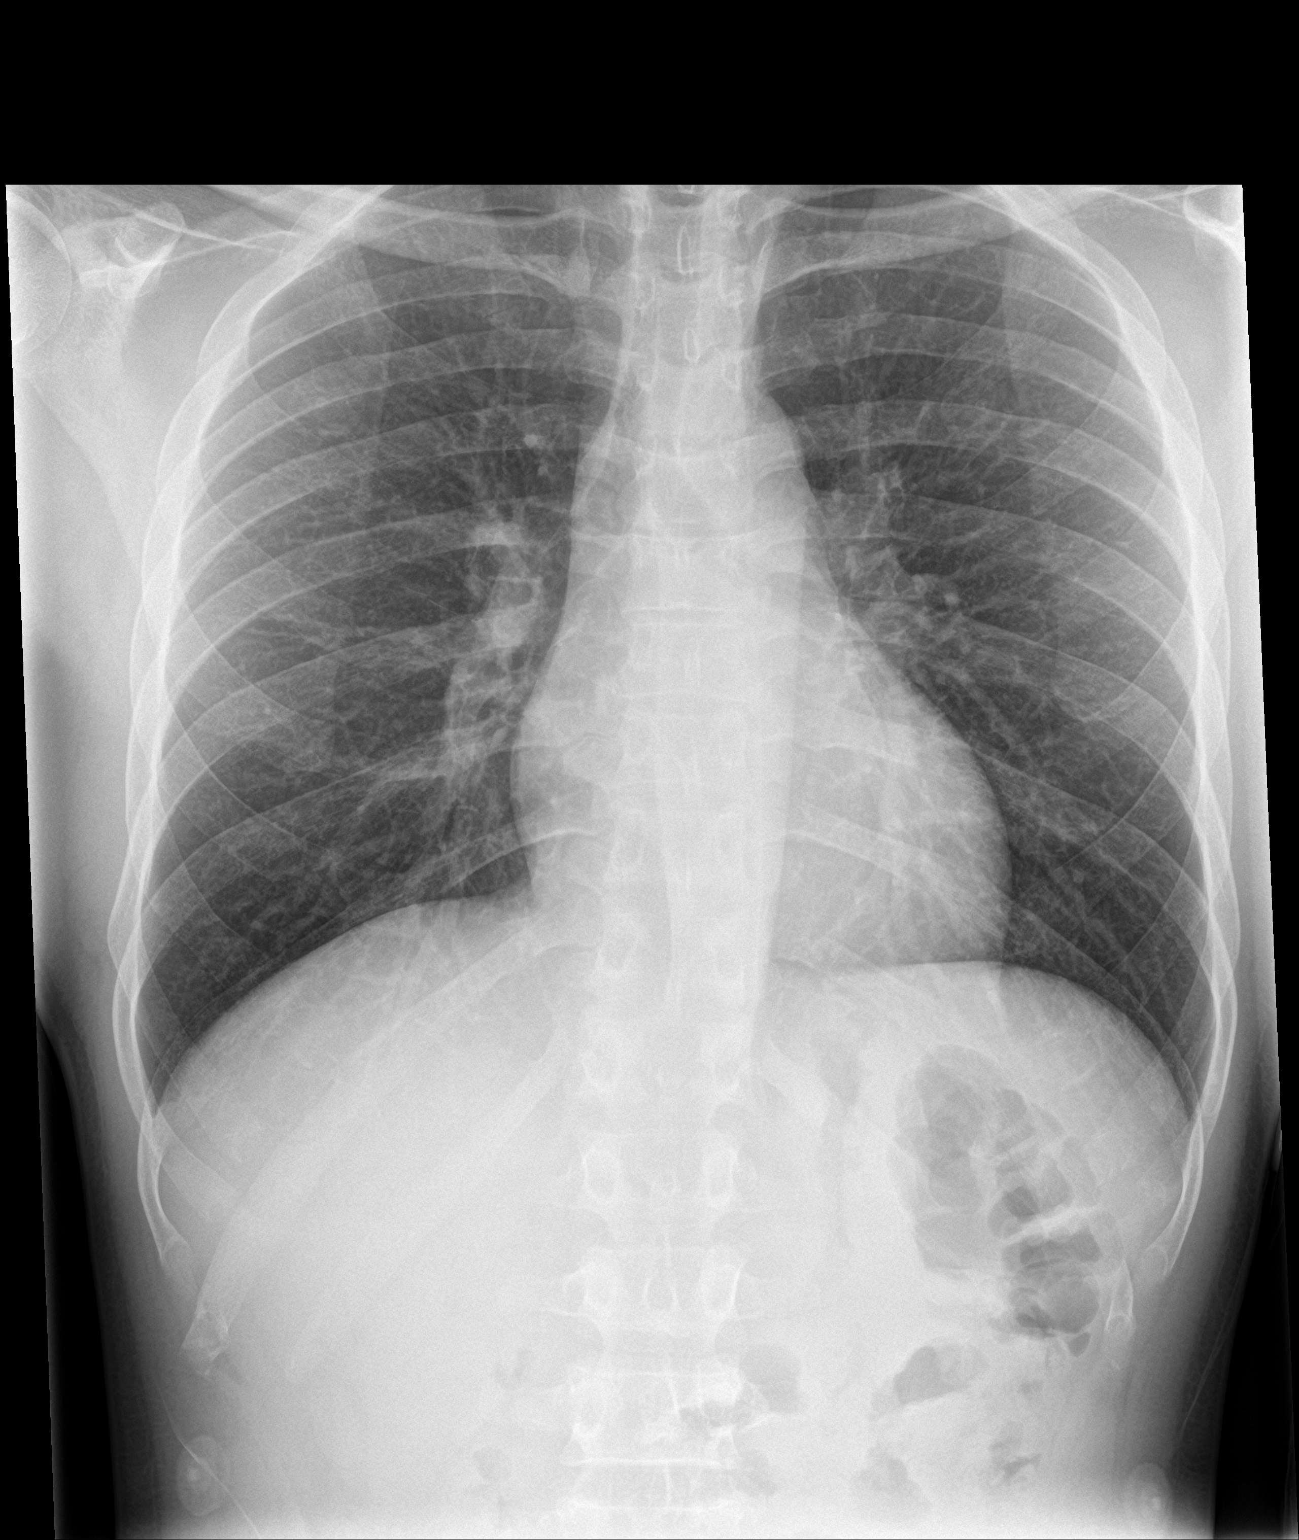

[chest lat]
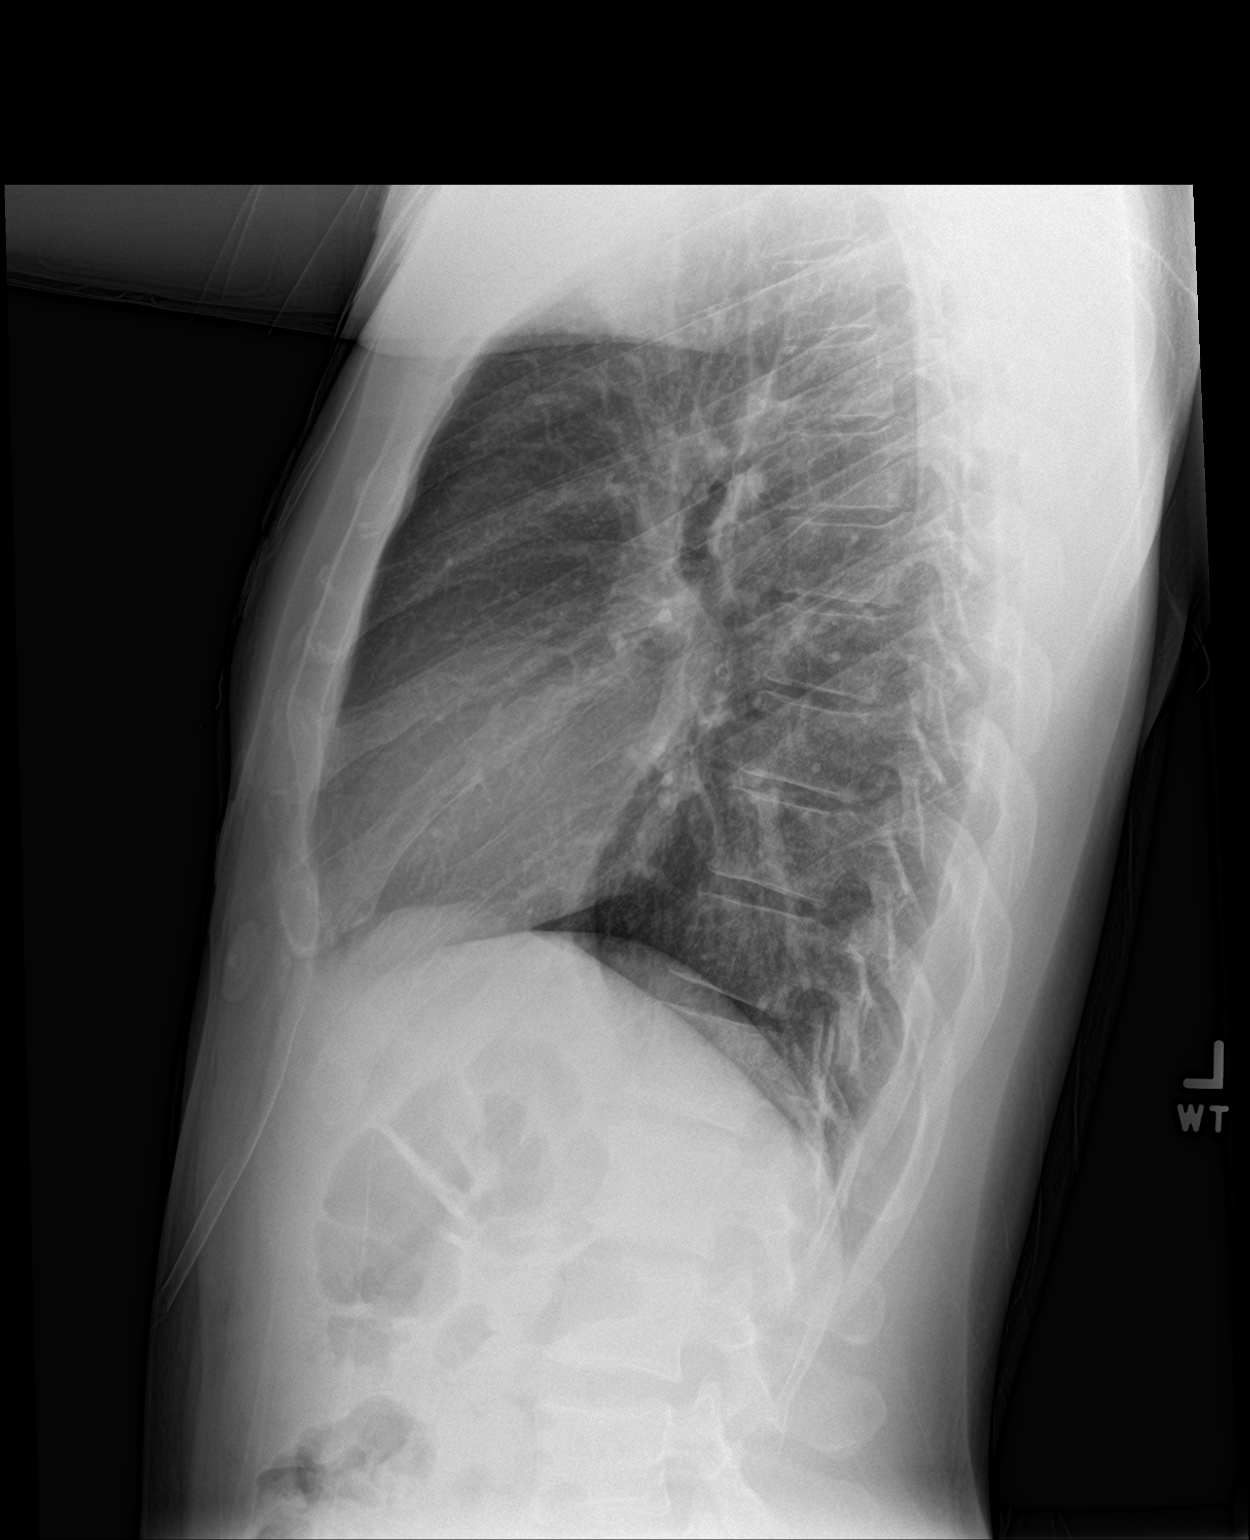

[2 of 2 positions shown; findings below may reference images not displayed]

FINDINGS: Cardiomediastinal contours and hilar structures are normal. Lungs
are clear. No evidence of pleural effusion. Visualized skeletal
structures are unremarkable.
IMPRESSION: No acute cardiopulmonary disease.

## 2021-06-10 ENCOUNTER — Encounter (HOSPITAL_COMMUNITY): Payer: Self-pay | Admitting: Emergency Medicine

## 2021-06-10 ENCOUNTER — Other Ambulatory Visit: Payer: Self-pay

## 2021-06-10 ENCOUNTER — Ambulatory Visit (HOSPITAL_COMMUNITY)
Admission: EM | Admit: 2021-06-10 | Discharge: 2021-06-10 | Disposition: A | Discharge status: Home / Self Care | Payer: Self-pay | Attending: Physician Assistant | Admitting: Physician Assistant

## 2021-06-10 DIAGNOSIS — F17200 Nicotine dependence, unspecified, uncomplicated: Secondary | ICD-10-CM | POA: Insufficient documentation

## 2021-06-10 DIAGNOSIS — J069 Acute upper respiratory infection, unspecified: Secondary | ICD-10-CM | POA: Insufficient documentation

## 2021-06-10 DIAGNOSIS — R051 Acute cough: Secondary | ICD-10-CM | POA: Insufficient documentation

## 2021-06-10 DIAGNOSIS — Z20822 Contact with and (suspected) exposure to covid-19: Secondary | ICD-10-CM | POA: Insufficient documentation

## 2021-06-10 DIAGNOSIS — Z8616 Personal history of COVID-19: Secondary | ICD-10-CM | POA: Insufficient documentation

## 2021-06-10 LAB — POC INFLUENZA A AND B ANTIGEN (URGENT CARE ONLY)
INFLUENZA A ANTIGEN, POC: NEGATIVE
INFLUENZA B ANTIGEN, POC: NEGATIVE

## 2021-06-10 MED ORDER — ALBUTEROL SULFATE HFA 108 (90 BASE) MCG/ACT IN AERS: 1.0000 | INHALATION_SPRAY | Freq: Four times a day (QID) | RESPIRATORY_TRACT | 0 refills | Status: AC | PRN

## 2021-06-10 MED ORDER — PROMETHAZINE-DM 6.25-15 MG/5ML PO SYRP: 5.0000 mL | ORAL_SOLUTION | Freq: Two times a day (BID) | ORAL | 0 refills | Status: AC | PRN

## 2021-06-10 NOTE — Discharge Instructions (Signed)
Your testing was negative for flu.  We will contact you if your COVID test is positive.  Make sure you rest and drink plenty of fluid.  Use albuterol every 4-6 hours as needed for shortness of breath and coughing fits.  Take Promethazine DM for cough.  This can make you sleepy so do not drive or drink alcohol while taking it.  Use Mucinex, Flonase, Tylenol, antihistamine such as Zyrtec for additional symptom relief.  If anything worsens and you develop high fever, chest pain, shortness of breath, nausea/vomiting interfering with oral intake, weakness you need to be seen immediately. ?

## 2021-06-10 NOTE — ED Triage Notes (Signed)
Pt reports a cough and nasal congestion since Monday. States he had a sore throat that has now resolved.  ?

## 2021-06-10 NOTE — ED Provider Notes (Signed)
?[facility] ? ? ? ?CSN: [id number] ?Arrival date & time: 06/10/21  1500 ? ? ?  ? ?History   ?Chief Complaint ?Chief Complaint  ?Patient presents with  ? Nasal Congestion  ? Cough  ? ? ?HPI ?HRIDHAAN YOHN is a 46 y.o. male.  ? ?Patient presents today with a 3 to 4-day history of URI symptoms.  Reports cough, nasal congestion.  Denies any chest pain, shortness of breath, nausea, vomiting, headache, dizziness, fever.  Denies any known sick contacts but does work around many individuals similar to moving out with an illness.  He has had COVID approximately 6 months ago.  He has had COVID-vaccine but has not had booster.  He denies any history of allergies, asthma, COPD.  He is a smoker.  Denies any recent antibiotic or steroid use.  He has missed work as a result of symptoms. ? ? ?History reviewed. No pertinent past medical history. ? ?Patient Active Problem List  ? Diagnosis Date Noted  ? Encounter for screening for COVID-19 04/07/2020  ? ? ?History reviewed. No pertinent surgical history. ? ? ? ? ?Home Medications   ? ?Prior to Admission medications   ?Medication Sig Start Date End Date Taking? Authorizing Provider  ?promethazine-dextromethorphan (PROMETHAZINE-DM) 6.25-15 MG/5ML syrup Take 5 mLs by mouth 2 (two) times daily as needed for cough. 06/10/21  Yes Kouper Spinella K, PA-C  ?albuterol (VENTOLIN HFA) 108 (90 Base) MCG/ACT inhaler Inhale 1-2 puffs into the lungs every 6 (six) hours as needed for wheezing or shortness of breath. 06/10/21   Corneshia Hines, Noberto Retort, PA-C  ?LORazepam (ATIVAN) 1 MG tablet Take 1 tablet (1 mg total) by mouth every 8 (eight) hours as needed for anxiety. ?Patient not taking: Reported on 04/07/2020 07/08/19   Zadie Rhine, MD  ?sodium chloride (OCEAN) 0.65 % SOLN nasal spray Place 2 sprays into both nostrils as needed for up to 15 days for congestion. 03/20/19 07/07/19  Darr, Gerilyn Pilgrim, PA-C  ? ? ?Family History ?History reviewed. No pertinent family history. ? ?Social History ?Social History   ? ?Tobacco Use  ? Smoking status: Former  ?  Packs/day: 0.50  ?  Types: Cigarettes  ?  Quit date: 04/07/2020  ?  Years since quitting: 1.1  ? Smokeless tobacco: Never  ?Substance Use Topics  ? Alcohol use: Yes  ?  Alcohol/week: 1.0 standard drink  ?  Types: 1 Cans of beer per week  ?  Comment: beer/day  ? Drug use: No  ? ? ? ?Allergies   ?Patient has no known allergies. ? ? ?Review of Systems ?Review of Systems  ?Constitutional:  Positive for activity change. Negative for appetite change, fatigue and fever.  ?HENT:  Positive for congestion and sore throat (Improved). Negative for sinus pressure and sneezing.   ?Respiratory:  Positive for cough. Negative for shortness of breath.   ?Cardiovascular:  Negative for chest pain.  ?Gastrointestinal:  Negative for abdominal pain, diarrhea, nausea and vomiting.  ?Neurological:  Negative for dizziness, light-headedness and headaches.  ? ? ?Physical Exam ?Triage Vital Signs ?ED Triage Vitals  ?Enc Vitals Group  ?   BP 06/10/21 1558 130/90  ?   Pulse Rate 06/10/21 1558 83  ?   Resp 06/10/21 1558 16  ?   Temp 06/10/21 1558 98.2 ?F (36.8 ?C)  ?   Temp Source 06/10/21 1558 Oral  ?   SpO2 06/10/21 1558 96 %  ?   Weight 06/10/21 1558 145 lb 8.1 oz (66 kg)  ?  Height 06/10/21 1558 5\' 9"  (1.753 m)  ?   Head Circumference --   ?   Peak Flow --   ?   Pain Score 06/10/21 1557 0  ?   Pain Loc --   ?   Pain Edu? --   ?   Excl. in GC? --   ? ?No data found. ? ?Updated Vital Signs ?BP 130/90 (BP Location: Left Arm)   Pulse 83   Temp 98.2 ?F (36.8 ?C) (Oral)   Resp 16   Ht 5\' 9"  (1.753 m)   Wt 145 lb 8.1 oz (66 kg)   SpO2 96%   BMI 21.49 kg/m?  ? ?Visual Acuity ?Right Eye Distance:   ?Left Eye Distance:   ?Bilateral Distance:   ? ?Right Eye Near:   ?Left Eye Near:    ?Bilateral Near:    ? ?Physical Exam ?Vitals reviewed.  ?Constitutional:   ?   General: He is awake.  ?   Appearance: Normal appearance. He is well-developed. He is not ill-appearing.  ?   Comments: Very pleasant male  appears stated age in no acute distress sitting comfortably in exam room  ?HENT:  ?   Head: Normocephalic and atraumatic.  ?   Right Ear: Tympanic membrane, ear canal and external ear normal. Tympanic membrane is not erythematous or bulging.  ?   Left Ear: Tympanic membrane, ear canal and external ear normal. Tympanic membrane is not erythematous or bulging.  ?   Nose: Nose normal.  ?   Mouth/Throat:  ?   Pharynx: Uvula midline. Posterior oropharyngeal erythema present. No oropharyngeal exudate or uvula swelling.  ?Cardiovascular:  ?   Rate and Rhythm: Normal rate and regular rhythm.  ?   Heart sounds: Normal heart sounds, S1 normal and S2 normal. No murmur heard. ?Pulmonary:  ?   Effort: Pulmonary effort is normal. No accessory muscle usage or respiratory distress.  ?   Breath sounds: Normal breath sounds. No stridor. No wheezing, rhonchi or rales.  ?   Comments: Clear to auscultation bilaterally ?Neurological:  ?   Mental Status: He is alert.  ?Psychiatric:     ?   Behavior: Behavior is cooperative.  ? ? ? ?UC Treatments / Results  ?Labs ?(all labs ordered are listed, but only abnormal results are displayed) ?Labs Reviewed  ?SARS CORONAVIRUS 2 (TAT 6-24 HRS)  ?POC INFLUENZA A AND B ANTIGEN (URGENT CARE ONLY)  ? ? ?EKG ? ? ?Radiology ?No results found. ? ?Procedures ?Procedures (including critical care time) ? ?Medications Ordered in UC ?Medications - No data to display ? ?Initial Impression / Assessment and Plan / UC Course  ?I have reviewed the triage vital signs and the nursing notes. ? ?Pertinent labs & imaging results that were available during my care of the patient were reviewed by me and considered in my medical decision making (see chart for details). ? ?  ? ?Influenza testing was negative in clinic today.  COVID testing is pending.  Discussed likely viral etiology.  No evidence of acute infection that would warrant initiation of antibiotics.  Patient was provided refill of albuterol to have on hand.  He  was given Promethazine DM for cough with instruction not to drive or drink alcohol while taking this medication as drowsiness is a common side effect.  He is to rest and drink plenty of fluids.  Recommended over-the-counter medications including Mucinex, Flonase, Tylenol for additional symptom relief.  He was provided work excuse note with current CDC  return to work guidelines based on result.  Discussed alarm symptoms that warrant emergent evaluation including chest pain, shortness of breath, nausea, vomiting.  Strict return precautions given to which she expressed understanding.  Work excuse note provided. ? ?Final Clinical Impressions(s) / UC Diagnoses  ? ?Final diagnoses:  ?Upper respiratory tract infection, unspecified type  ?Acute cough  ? ? ? ?Discharge Instructions   ? ?  ?Your testing was negative for flu.  We will contact you if your COVID test is positive.  Make sure you rest and drink plenty of fluid.  Use albuterol every 4-6 hours as needed for shortness of breath and coughing fits.  Take Promethazine DM for cough.  This can make you sleepy so do not drive or drink alcohol while taking it.  Use Mucinex, Flonase, Tylenol, antihistamine such as Zyrtec for additional symptom relief.  If anything worsens and you develop high fever, chest pain, shortness of breath, nausea/vomiting interfering with oral intake, weakness you need to be seen immediately. ? ? ? ? ?ED Prescriptions   ? ? Medication Sig Dispense Auth. Provider  ? promethazine-dextromethorphan (PROMETHAZINE-DM) 6.25-15 MG/5ML syrup Take 5 mLs by mouth 2 (two) times daily as needed for cough. 118 mL Dazhane Villagomez K, PA-C  ? albuterol (VENTOLIN HFA) 108 (90 Base) MCG/ACT inhaler Inhale 1-2 puffs into the lungs every 6 (six) hours as needed for wheezing or shortness of breath. 18 g Julieta Rogalski K, PA-C  ? ?  ? ?PDMP not reviewed this encounter. ?  ?Jeani HawkingRaspet, Emery Binz K, PA-C ?06/10/21 1707 ? ?

## 2021-06-11 LAB — SARS CORONAVIRUS 2 (TAT 6-24 HRS): SARS Coronavirus 2: NEGATIVE

## 2021-11-18 ENCOUNTER — Ambulatory Visit (HOSPITAL_COMMUNITY)
Admission: EM | Admit: 2021-11-18 | Discharge: 2021-11-18 | Disposition: A | Discharge status: Home / Self Care | Payer: Commercial Managed Care - HMO | Attending: Internal Medicine | Admitting: Internal Medicine

## 2021-11-18 ENCOUNTER — Encounter (HOSPITAL_COMMUNITY): Payer: Self-pay | Admitting: Emergency Medicine

## 2021-11-18 DIAGNOSIS — Z20822 Contact with and (suspected) exposure to covid-19: Secondary | ICD-10-CM | POA: Insufficient documentation

## 2021-11-18 DIAGNOSIS — B349 Viral infection, unspecified: Secondary | ICD-10-CM | POA: Diagnosis present

## 2021-11-18 MED ORDER — IBUPROFEN 800 MG PO TABS
800.0000 mg | ORAL_TABLET | Freq: Once | ORAL | Status: AC
  Administered 2021-11-18: 800 mg via ORAL

## 2021-11-18 MED ORDER — ACETAMINOPHEN 500 MG PO TABS: 1000.0000 mg | ORAL_TABLET | Freq: Four times a day (QID) | ORAL | 0 refills | Status: AC | PRN

## 2021-11-18 MED ORDER — IBUPROFEN 800 MG PO TABS
ORAL_TABLET | ORAL | Status: AC
  Filled 2021-11-18: qty 1

## 2021-11-18 MED ORDER — IBUPROFEN 600 MG PO TABS: 600.0000 mg | ORAL_TABLET | Freq: Four times a day (QID) | ORAL | 0 refills | Status: AC | PRN

## 2021-11-18 MED ORDER — ONDANSETRON 4 MG PO TBDP: 4.0000 mg | ORAL_TABLET | Freq: Three times a day (TID) | ORAL | 0 refills | Status: DC | PRN

## 2021-11-18 NOTE — Discharge Instructions (Addendum)
COVID-19 testing is pending.  We will call you if your result is positive. Work note was at the back of your packet.  Suspect your symptoms are related to a viral illness that will resolve on its own in time with rest, increase fluids, and medications.  Stay at home until your COVID test comes back so as not to infect anyone else.  If you have to go out into public, wear a mask.  Take Zofran every 8 hours as needed for nausea. Eat a bland diet over the next 24 to 48 hours so that your stomach can rest. Increase your water intake to at least 8 cups of water per day to prevent dehydration. Take ibuprofen and Tylenol every 6 hours as needed for body aches, chills, and fever. We gave you ibuprofen in the clinic so your next dose may be at 8 PM tonight if needed.  Take this medicine with food to avoid stomach upset.  If you develop any new or worsening symptoms or do not improve in the next 2 to 3 days, please return.  If your symptoms are severe, please go to the emergency room.  Follow-up with your primary care provider for further evaluation and management of your symptoms as well as ongoing wellness visits.  I hope you feel better!

## 2021-11-18 NOTE — ED Triage Notes (Signed)
Patient c/o generalized body aches that started this morning.   Patient denies fever. Patient denies any other cold symptoms.   Patient endorses 2 episodes of runny stools this morning.   Patient endorses COVID exposure at work.   Patient hasn't taken any medications this morning.

## 2021-11-18 NOTE — ED Provider Notes (Signed)
[facility]    CSN: [id number] Arrival date & time: 11/18/21  4034      History   Chief Complaint Chief Complaint  Patient presents with   Generalized Body Aches    HPI Troy Barnett is a 46 y.o. male.   Patient presents urgent care for evaluation of generalized body aches and loose stools that started yesterday.  States that one of his coworkers recently got back from the [place] and exposed to a lot of people at work to [place].  Approximately 3-4 people at his job are out sick due to COVID right now and he is concerned that he may also have COVID-19.  Denies sore throat, fever/chills, ear pain, headache, blurry vision, decreased appetite, difficulty swallowing, shortness of breath, chest pain, and weakness.  He states that he feels generalized fatigue and states he feels "as if he had been working out at [employer] but he has not."  Denies blood and mucus to the stools.  Denies watery stools.  No recent antibiotic use reported.  Stools are softer/looser than normal.  States he had an episode of nausea this morning without vomiting.  He is not currently nauseous and denies abdominal discomfort, constipation, urinary symptoms, and back pain.  He has not attempted use of any over-the-counter medications prior to arrival urgent care for his symptoms.  No other triggering or relieving factors identified at this time.     History reviewed. No pertinent past medical history.  Patient Active Problem List   Diagnosis Date Noted   Encounter for screening for COVID-19 04/07/2020    History reviewed. No pertinent surgical history.     Home Medications    Prior to Admission medications   Medication Sig Start Date End Date Taking? Authorizing Provider  acetaminophen (TYLENOL) 500 MG tablet Take 2 tablets (1,000 mg total) by mouth every 6 (six) hours as needed. 11/18/21  Yes Carlisle Beers, FNP  ibuprofen (ADVIL) 600 MG tablet Take 1 tablet (600 mg total) by mouth every 6  (six) hours as needed. 11/18/21  Yes Carlisle Beers, FNP  ondansetron (ZOFRAN-ODT) 4 MG disintegrating tablet Take 1 tablet (4 mg total) by mouth every 8 (eight) hours as needed for nausea or vomiting. 11/18/21  Yes Carlisle Beers, FNP  albuterol (VENTOLIN HFA) 108 (90 Base) MCG/ACT inhaler Inhale 1-2 puffs into the lungs every 6 (six) hours as needed for wheezing or shortness of breath. 06/10/21   Raspet, Denny Peon K, PA-C  LORazepam (ATIVAN) 1 MG tablet Take 1 tablet (1 mg total) by mouth every 8 (eight) hours as needed for anxiety. Patient not taking: Reported on 04/07/2020 07/08/19   Zadie Rhine, MD  promethazine-dextromethorphan (PROMETHAZINE-DM) 6.25-15 MG/5ML syrup Take 5 mLs by mouth 2 (two) times daily as needed for cough. 06/10/21   Raspet, Noberto Retort, PA-C  sodium chloride (OCEAN) 0.65 % SOLN nasal spray Place 2 sprays into both nostrils as needed for up to 15 days for congestion. 03/20/19 07/07/19  Darr, Gerilyn Pilgrim, PA-C    Family History History reviewed. No pertinent family history.  Social History Social History   Tobacco Use   Smoking status: Former    Packs/day: 0.50    Types: Cigarettes    Quit date: 04/07/2020    Years since quitting: 1.6   Smokeless tobacco: Never  Substance Use Topics   Alcohol use: Not Currently    Alcohol/week: 1.0 standard drink of alcohol    Types: 1 Cans of beer per week  Drug use: No     Allergies   Patient has no known allergies.   Review of Systems Review of Systems Per HPI  Physical Exam Triage Vital Signs ED Triage Vitals  Enc Vitals Group     BP 11/18/21 1028 (!) 136/92     Pulse Rate 11/18/21 1028 72     Resp 11/18/21 1028 12     Temp 11/18/21 1028 98.7 F (37.1 C)     Temp Source 11/18/21 1028 Oral     SpO2 11/18/21 1028 96 %     Weight --      Height --      Head Circumference --      Peak Flow --      Pain Score 11/18/21 1033 8     Pain Loc --      Pain Edu? --      Excl. in [place]? --    No data  found.  Updated Vital Signs BP (!) 136/92 (BP Location: Left Arm)   Pulse 72   Temp 98.7 F (37.1 C) (Oral)   Resp 12   SpO2 96%   Visual Acuity Right Eye Distance:   Left Eye Distance:   Bilateral Distance:    Right Eye Near:   Left Eye Near:    Bilateral Near:     Physical Exam Vitals and nursing note reviewed.  Constitutional:      Appearance: Normal appearance. He is not ill-appearing or toxic-appearing.     Comments: Very pleasant patient sitting on exam in position of comfort table in no acute distress.   HENT:     Head: Normocephalic and atraumatic.     Right Ear: Hearing, tympanic membrane, ear canal and external ear normal.     Left Ear: Hearing, tympanic membrane, ear canal and external ear normal.     Nose: Nose normal. No congestion or rhinorrhea.     Mouth/Throat:     Lips: Pink.     Mouth: Mucous membranes are moist.     Pharynx: No posterior oropharyngeal erythema.  Eyes:     General: Lids are normal. Vision grossly intact. Gaze aligned appropriately.     Extraocular Movements: Extraocular movements intact.     Conjunctiva/sclera: Conjunctivae normal.     Pupils: Pupils are equal, round, and reactive to light.  Cardiovascular:     Rate and Rhythm: Normal rate and regular rhythm.     Heart sounds: Normal heart sounds, S1 normal and S2 normal.  Pulmonary:     Effort: Pulmonary effort is normal. No respiratory distress.     Breath sounds: Normal breath sounds and air entry.  Abdominal:     General: Bowel sounds are normal.     Palpations: Abdomen is soft.     Tenderness: There is no abdominal tenderness. There is no right CVA tenderness, left CVA tenderness or guarding.  Musculoskeletal:     Cervical back: Neck supple.  Skin:    General: Skin is warm and dry.     Capillary Refill: Capillary refill takes less than 2 seconds.     Findings: No rash.  Neurological:     General: No focal deficit present.     Mental Status: He is alert and oriented to  person, place, and time. Mental status is at baseline.     Cranial Nerves: No dysarthria or facial asymmetry.     Motor: No weakness.     Gait: Gait is intact. Gait normal.  Psychiatric:  Mood and Affect: Mood normal.        Speech: Speech normal.        Behavior: Behavior normal.        Thought Content: Thought content normal.        Judgment: Judgment normal.      UC Treatments / Results  Labs (all labs ordered are listed, but only abnormal results are displayed) Labs Reviewed  SARS CORONAVIRUS 2 (TAT 6-24 HRS)    EKG   Radiology No results found.  Procedures Procedures (including critical care time)  Medications Ordered in UC Medications  ibuprofen (ADVIL) tablet 800 mg (800 mg Oral Given 11/18/21 1202)    Initial Impression / Assessment and Plan / UC Course  I have reviewed the triage vital signs and the nursing notes.  Pertinent labs & imaging results that were available during my care of the patient were reviewed by me and considered in my medical decision making (see chart for details).   1.  Viral syndrome Patient's symptoms are likely related to a viral syndrome.  Doubt acute bacterial cause for loose stools at this time as patient has not recently taken any antibiotics and denies recent travel/camping.  We will manage this with conservative treatment for symptom relief.  Suspect patient will recover well from this with rest, fluids, and prescriptions.  COVID-19 testing is pending.  We will call patient if this is positive.  Zofran 4 mg every 8 hours sent to the pharmacy for nausea and vomiting.  Advised to eat a bland diet over the next 12 to 48 hours then increase his diet as tolerated.  Increase water intake advised to prevent dehydration.  Ibuprofen and Tylenol may be used every 6 hours as needed for fever/chills as well as discomfort associated with viral illness.     Strict ED/urgent care return precautions given.  Patient verbalizes understanding and  agreement with plan.  Counseled patient regarding possible side effects and uses of all medications prescribed at today's visit.  Patient verbalizes understanding and agreement with plan.  All questions answered.  Patient discharged from urgent care in stable condition.   Final Clinical Impressions(s) / UC Diagnoses   Final diagnoses:  Viral syndrome     Discharge Instructions      COVID-19 testing is pending.  We will call you if your result is positive. Work note was at the back of your packet.  Suspect your symptoms are related to a viral illness that will resolve on its own in time with rest, increase fluids, and medications.  Stay at home until your COVID test comes back so as not to infect anyone else.  If you have to go out into public, wear a mask.  Take Zofran every 8 hours as needed for nausea. Eat a bland diet over the next 24 to 48 hours so that your stomach can rest. Increase your water intake to at least 8 cups of water per day to prevent dehydration. Take ibuprofen and Tylenol every 6 hours as needed for body aches, chills, and fever. We gave you ibuprofen in the clinic so your next dose may be at 8 PM tonight if needed.  Take this medicine with food to avoid stomach upset.  If you develop any new or worsening symptoms or do not improve in the next 2 to 3 days, please return.  If your symptoms are severe, please go to the emergency room.  Follow-up with your primary care provider for further evaluation and management of  your symptoms as well as ongoing wellness visits.  I hope you feel better!     ED Prescriptions     Medication Sig Dispense Auth. Provider   ondansetron (ZOFRAN-ODT) 4 MG disintegrating tablet Take 1 tablet (4 mg total) by mouth every 8 (eight) hours as needed for nausea or vomiting. 20 tablet Reita May M, FNP   ibuprofen (ADVIL) 600 MG tablet Take 1 tablet (600 mg total) by mouth every 6 (six) hours as needed. 30 tablet Reita May M,  FNP   acetaminophen (TYLENOL) 500 MG tablet Take 2 tablets (1,000 mg total) by mouth every 6 (six) hours as needed. 30 tablet Carlisle Beers, FNP      PDMP not reviewed this encounter.   Carlisle Beers, [place] 11/19/21 [phone]

## 2021-11-19 LAB — SARS CORONAVIRUS 2 (TAT 6-24 HRS): SARS Coronavirus 2: NEGATIVE

## 2022-02-15 ENCOUNTER — Other Ambulatory Visit: Payer: Self-pay

## 2022-02-15 ENCOUNTER — Emergency Department (HOSPITAL_COMMUNITY)
Admission: EM | Admit: 2022-02-15 | Discharge: 2022-02-16 | Disposition: A | Discharge status: Home / Self Care | Payer: Commercial Managed Care - HMO | Attending: Emergency Medicine | Admitting: Emergency Medicine

## 2022-02-15 ENCOUNTER — Encounter (HOSPITAL_COMMUNITY): Payer: Self-pay

## 2022-02-15 DIAGNOSIS — J069 Acute upper respiratory infection, unspecified: Secondary | ICD-10-CM | POA: Diagnosis not present

## 2022-02-15 DIAGNOSIS — F17200 Nicotine dependence, unspecified, uncomplicated: Secondary | ICD-10-CM | POA: Diagnosis not present

## 2022-02-15 DIAGNOSIS — R059 Cough, unspecified: Secondary | ICD-10-CM | POA: Diagnosis present

## 2022-02-15 DIAGNOSIS — Z1152 Encounter for screening for COVID-19: Secondary | ICD-10-CM | POA: Diagnosis not present

## 2022-02-15 LAB — RESP PANEL BY RT-PCR (FLU A&B, COVID) ARPGX2
Influenza A by PCR: NEGATIVE
Influenza B by PCR: NEGATIVE
SARS Coronavirus 2 by RT PCR: NEGATIVE

## 2022-02-15 NOTE — ED Provider Triage Note (Signed)
Emergency Medicine Provider Triage Evaluation Note  Troy Barnett , a 46 y.o. male  was evaluated in triage.  Pt complains of dry cough, nasal congestion, shortness of breath, chills in the last 6 days.  Unknown sick contacts.  Patient has tried Robitussin and TheraFlu with no relief.  Denies any chest pain, nausea, vomiting, bowel changes, urinary symptoms.  Review of Systems  Positive: As above Negative: As above  Physical Exam  BP (!) 143/96 (BP Location: Right Arm)   Pulse 79   Temp 98 F (36.7 C) (Oral)   Resp 18   Ht 5\' 8"  (1.727 m)   Wt 67.1 kg   SpO2 97%   BMI 22.50 kg/m  Gen:   Awake, no distress   Resp:  Normal effort, lungs are clear, no wheezes or rales. MSK:   Moves extremities without difficulty  Other:    Medical Decision Making  Medically screening exam initiated at 8:22 PM.  Appropriate orders placed.  Troy Barnett was informed that the remainder of the evaluation will be completed by another provider, this initial triage assessment does not replace that evaluation, and the importance of remaining in the ED until their evaluation is complete.     Laury Axon, Jeanelle Malling 02/15/22 2119

## 2022-02-15 NOTE — ED Triage Notes (Signed)
Pt states he has had cough, congestion, and shortness of breath for several days. Pt has taken theraflu, robitussin and home remedies w/o relief. Pt states he has productive cough w/yellow phlegm. Pt denies fevers.

## 2022-02-16 ENCOUNTER — Emergency Department (HOSPITAL_COMMUNITY): Payer: Commercial Managed Care - HMO

## 2022-02-16 MED ORDER — ALBUTEROL SULFATE HFA 108 (90 BASE) MCG/ACT IN AERS
1.0000 | INHALATION_SPRAY | Freq: Once | RESPIRATORY_TRACT | Status: AC
  Administered 2022-02-16: 1 via RESPIRATORY_TRACT
  Filled 2022-02-16: qty 6.7

## 2022-02-16 NOTE — ED Provider Notes (Signed)
[facility] EMERGENCY DEPARTMENT Provider Note   CSN: [id number] Arrival date & time: 02/15/22  2004     History  Chief Complaint  Patient presents with   Nasal Congestion    Troy HELDT is a 46 y.o. male.  HPI   Without significant medical history presents with complaints of URI-like symptoms, started about 1 week ago, after he got back from a funeral in [place], states he has nasal congestion and a productive cough, states he is short of breath only with coughing, no pleuritic pain, no leg swelling or calf tenderness, no history PEs or DVTs, not on hormone therapy, no fevers no chills no general body aches, denies any recent sick contacts, not immunocompromise, has not gotten his COVID or his influenza vaccine.    Home Medications Prior to Admission medications   Medication Sig Start Date End Date Taking? Authorizing Provider  acetaminophen (TYLENOL) 500 MG tablet Take 2 tablets (1,000 mg total) by mouth every 6 (six) hours as needed. 11/18/21   Carlisle Beers, FNP  albuterol (VENTOLIN HFA) 108 (90 Base) MCG/ACT inhaler Inhale 1-2 puffs into the lungs every 6 (six) hours as needed for wheezing or shortness of breath. 06/10/21   Raspet, Noberto Retort, PA-C  ibuprofen (ADVIL) 600 MG tablet Take 1 tablet (600 mg total) by mouth every 6 (six) hours as needed. 11/18/21   Carlisle Beers, FNP  LORazepam (ATIVAN) 1 MG tablet Take 1 tablet (1 mg total) by mouth every 8 (eight) hours as needed for anxiety. Patient not taking: Reported on 04/07/2020 07/08/19   Zadie Rhine, MD  ondansetron (ZOFRAN-ODT) 4 MG disintegrating tablet Take 1 tablet (4 mg total) by mouth every 8 (eight) hours as needed for nausea or vomiting. 11/18/21   Carlisle Beers, FNP  promethazine-dextromethorphan (PROMETHAZINE-DM) 6.25-15 MG/5ML syrup Take 5 mLs by mouth 2 (two) times daily as needed for cough. 06/10/21   Raspet, Noberto Retort, PA-C  sodium chloride (OCEAN) 0.65 % SOLN nasal spray  Place 2 sprays into both nostrils as needed for up to 15 days for congestion. 03/20/19 07/07/19  Darr, Gerilyn Pilgrim, PA-C      Allergies    Patient has no known allergies.    Review of Systems   Review of Systems  Constitutional:  Negative for chills and fever.  HENT:  Positive for congestion.   Respiratory:  Positive for cough. Negative for shortness of breath.   Cardiovascular:  Negative for chest pain.  Gastrointestinal:  Negative for abdominal pain.  Neurological:  Negative for headaches.    Physical Exam Updated Vital Signs BP 130/89 (BP Location: Right Arm)   Pulse 63   Temp 97.7 F (36.5 C) (Oral)   Resp 17   Ht 5\' 8"  (1.727 m)   Wt 67.1 kg   SpO2 99%   BMI 22.50 kg/m  Physical Exam Vitals and nursing note reviewed.  Constitutional:      General: He is not in acute distress.    Appearance: He is not ill-appearing.  HENT:     Head: Normocephalic and atraumatic.     Nose: No congestion.  Eyes:     Conjunctiva/sclera: Conjunctivae normal.  Cardiovascular:     Rate and Rhythm: Normal rate and regular rhythm.     Pulses: Normal pulses.     Heart sounds: No murmur heard.    No friction rub. No gallop.  Pulmonary:     Effort: No respiratory distress.     Breath sounds: Wheezing present.  No rhonchi or rales.     Comments: No respiratory distress nontachycardic not hypoxic speaking full sentences, slight intermittent expiratory wheezes heard in the lower lobes bilaterally. Musculoskeletal:     Right lower leg: No edema.     Left lower leg: No edema.     Comments: No leg swelling no calf tenderness no palpable cords.  Skin:    General: Skin is warm and dry.  Neurological:     Mental Status: He is alert.  Psychiatric:        Mood and Affect: Mood normal.     ED Results / Procedures / Treatments   Labs (all labs ordered are listed, but only abnormal results are displayed) Labs Reviewed  RESP PANEL BY RT-PCR (FLU A&B, COVID) [id number]    EKG None  Radiology DG  Chest 2 View  Result Date: 02/16/2022 CLINICAL DATA:  Nasal congestion, cough EXAM: CHEST - 2 VIEW COMPARISON:  05/03/2021 FINDINGS: The heart size and mediastinal contours are within normal limits. Both lungs are clear. The visualized skeletal structures are unremarkable. IMPRESSION: Normal study. Electronically Signed   By: Charlett Nose M.D.   On: 02/16/2022 01:42    Procedures Procedures    Medications Ordered in ED Medications  albuterol (VENTOLIN HFA) 108 (90 Base) MCG/ACT inhaler 1 puff (has no administration in time range)    ED Course/ Medical Decision Making/ A&P                           Medical Decision Making Amount and/or Complexity of Data Reviewed Radiology: ordered.  Risk Prescription drug management.   This patient presents to the ED for concern of congestion, this involves an extensive number of treatment options, and is a complaint that carries with it a high risk of complications and morbidity.  The differential diagnosis includes URI, pneumonia, PE    Additional history obtained:  Additional history obtained from the n/a External records from outside source obtained and reviewed including previous ER notes   Co morbidities that complicate the patient evaluation  Patient is a current smoker  Social Determinants of Health:  NA    Lab Tests:  I Ordered, and personally interpreted labs.  The pertinent results include: Rest panel negative   Imaging Studies ordered:  I ordered imaging studies including dg chest  I independently visualized and interpreted imaging which showed negative I agree with the radiologist interpretation   Cardiac Monitoring:  The patient was maintained on a cardiac monitor.  I personally viewed and interpreted the cardiac monitored which showed an underlying rhythm of: n/a   Medicines ordered and prescription drug management:  I ordered medication including albuterol inhaler I have reviewed the patients home  medicines and have made adjustments as needed  Critical Interventions:  N/a   Reevaluation: Presents with URI-like symptoms, triage obtain respiratory panel which was negative, slight wheezing present on exam will provide with a albuterol inhaler, obtain chest x-Balistreri for rule out of pneumonia  Patient is in agreement with discharge at this time  Consultations Obtained:  N/a   Test Considered:  N/a    Rule out Low suspicion for systemic infection as patient is nontoxic-appearing, vital signs reassuring, no obvious source infection noted on exam.  Low suspicion for pneumonia as lung sounds are clear bilaterally, x-Padmanabhan did not reveal any acute findings.  I have low suspicion for PE as patient denies pleuritic chest pain, shortness of breath, patient is PERC. low suspicion  for strep throat as oropharynx was visualized, no erythema or exudates noted.  Low suspicion patient would need  hospitalized due to viral infection or Covid as vital signs reassuring, patient is not in respiratory distress.      Dispostion and problem list  After consideration of the diagnostic results and the patients response to treatment, I feel that the patent would benefit from dc.   URI-likely viral nature, recommend symptom management follow-up with PCP as needed strict return precaution.           Final Clinical Impression(s) / ED Diagnoses Final diagnoses:  Viral upper respiratory tract infection    Rx / DC Orders ED Discharge Orders     None         Carroll Sage, PA-C 02/16/22 0149    Dione Booze, MD 02/16/22 [phone]

## 2022-02-16 NOTE — Discharge Instructions (Signed)
Likely a viral infection, recommend over-the-counter pain medications like ibuprofen Tylenol for fever and pain control, nasal decongestions like Flonase and Zyrtec, Mucinex for cough.  If not eating recommend supplementing with Gatorade to help with electrolyte supplementation.  Please use the inhaler for shortness of breath 1 to 2 puffs every 4-6 hours  Follow-up PCP for further evaluation.  Come back to the emergency department if you develop chest pain, shortness of breath, severe abdominal pain, uncontrolled nausea, vomiting, diarrhea.

## 2022-07-19 ENCOUNTER — Encounter (HOSPITAL_COMMUNITY): Payer: Self-pay

## 2022-07-19 ENCOUNTER — Ambulatory Visit (HOSPITAL_COMMUNITY)
Admission: EM | Admit: 2022-07-19 | Discharge: 2022-07-19 | Disposition: A | Discharge status: Home / Self Care | Payer: 59 | Attending: Internal Medicine | Admitting: Internal Medicine

## 2022-07-19 DIAGNOSIS — A084 Viral intestinal infection, unspecified: Secondary | ICD-10-CM | POA: Diagnosis not present

## 2022-07-19 MED ORDER — ONDANSETRON 4 MG PO TBDP: 4.0000 mg | ORAL_TABLET | Freq: Three times a day (TID) | ORAL | 0 refills | Status: AC | PRN

## 2022-07-19 MED ORDER — ONDANSETRON 4 MG PO TBDP
ORAL_TABLET | ORAL | Status: AC
  Filled 2022-07-19: qty 1

## 2022-07-19 MED ORDER — ONDANSETRON 4 MG PO TBDP
4.0000 mg | ORAL_TABLET | Freq: Once | ORAL | Status: AC
  Administered 2022-07-19: 4 mg via ORAL

## 2022-07-19 NOTE — ED Triage Notes (Signed)
Pt states diarrhea and vomiting for the past 2 days. Has been taking kaopectate at home with some relief.

## 2022-07-19 NOTE — Discharge Instructions (Signed)

## 2022-07-19 NOTE — ED Provider Notes (Signed)
[facility]    CSN: [id number] Arrival date & time: 07/19/22  4098      History   Chief Complaint Chief Complaint  Patient presents with   Diarrhea    HPI Troy Barnett is a 47 y.o. male.   Patient presents to urgent care for evaluation of nausea, vomiting, and diarrhea that started 3 days ago.  He states he went to a cookout on Saturday, July 16, 2022 where he had a hamburger cooked by his son-in-law.  He believes that the hamburger may not have been cooked all the way through.  He did not experience N/V/D until approximately 24 hours after eating the hamburger in the evening hours of Sunday, July 17, 2022.  Symptoms began with diarrhea and generalized abdominal discomfort 2 days ago on Sunday, April 21, then developed into nausea and vomiting yesterday.  He has had multiple episodes of nonbloody/nonbilious emesis over the last 24 hours and multiple episodes of nonbloody/nonmucous diarrhea over the last 48 hours.  States whenever he drinks anything, it "goes straight through him".  No recent known sick contacts with similar symptoms who ate the food at the cookout as well.  No other recent intake of foods outside of normal diet or water from an unknown source.  No viral URI symptoms.  No fever/chills, recent antibiotic or steroid use, or recent abdominal surgeries.  His significant other brought home Kaopectate and he has been taking this for the last 1 to 2 days without much relief of symptoms.  Currently nauseous but has not vomited in approximately 12 hours.  Has been able to keep down clear liquids since last episode of emesis.  No heart palpitations, chest pain, or shortness of breath.   Diarrhea   History reviewed. No pertinent past medical history.  Patient Active Problem List   Diagnosis Date Noted   Encounter for screening for COVID-19 04/07/2020    History reviewed. No pertinent surgical history.     Home Medications    Prior to Admission medications    Medication Sig Start Date End Date Taking? Authorizing Provider  ondansetron (ZOFRAN-ODT) 4 MG disintegrating tablet Take 1 tablet (4 mg total) by mouth every 8 (eight) hours as needed for nausea or vomiting. 07/19/22  Yes Carlisle Beers, FNP  acetaminophen (TYLENOL) 500 MG tablet Take 2 tablets (1,000 mg total) by mouth every 6 (six) hours as needed. 11/18/21   Carlisle Beers, FNP  albuterol (VENTOLIN HFA) 108 (90 Base) MCG/ACT inhaler Inhale 1-2 puffs into the lungs every 6 (six) hours as needed for wheezing or shortness of breath. 06/10/21   Raspet, Noberto Retort, PA-C  ibuprofen (ADVIL) 600 MG tablet Take 1 tablet (600 mg total) by mouth every 6 (six) hours as needed. 11/18/21   Carlisle Beers, FNP  LORazepam (ATIVAN) 1 MG tablet Take 1 tablet (1 mg total) by mouth every 8 (eight) hours as needed for anxiety. Patient not taking: Reported on 04/07/2020 07/08/19   Zadie Rhine, MD  promethazine-dextromethorphan (PROMETHAZINE-DM) 6.25-15 MG/5ML syrup Take 5 mLs by mouth 2 (two) times daily as needed for cough. 06/10/21   Raspet, Noberto Retort, PA-C  sodium chloride (OCEAN) 0.65 % SOLN nasal spray Place 2 sprays into both nostrils as needed for up to 15 days for congestion. 03/20/19 07/07/19  Darr, Gerilyn Pilgrim, PA-C    Family History History reviewed. No pertinent family history.  Social History Social History   Tobacco Use   Smoking status: Every Day    Packs/day: .  5    Types: Cigarettes    Last attempt to quit: 04/07/2020    Years since quitting: 2.2   Smokeless tobacco: Never  Vaping Use   Vaping Use: Never used  Substance Use Topics   Alcohol use: Not Currently    Alcohol/week: 1.0 standard drink of alcohol    Types: 1 Cans of beer per week   Drug use: No     Allergies   Patient has no known allergies.   Review of Systems Review of Systems  Gastrointestinal:  Positive for diarrhea.  Per HPI   Physical Exam Triage Vital Signs ED Triage Vitals  Enc Vitals Group     BP  07/19/22 0830 (!) 124/90     Pulse Rate 07/19/22 0830 (!) 110     Resp 07/19/22 0830 16     Temp 07/19/22 0830 98.3 F (36.8 C)     Temp Source 07/19/22 0830 Oral     SpO2 07/19/22 0830 96 %     Weight --      Height --      Head Circumference --      Peak Flow --      Pain Score 07/19/22 0832 0     Pain Loc --      Pain Edu? --      Excl. in GC? --    No data found.  Updated Vital Signs BP (!) 124/90 (BP Location: Right Arm)   Pulse (!) 110   Temp 98.3 F (36.8 C) (Oral)   Resp 16   SpO2 96%   Visual Acuity Right Eye Distance:   Left Eye Distance:   Bilateral Distance:    Right Eye Near:   Left Eye Near:    Bilateral Near:     Physical Exam Vitals and nursing note reviewed.  Constitutional:      Appearance: He is not ill-appearing or toxic-appearing.  HENT:     Head: Normocephalic and atraumatic.     Right Ear: Hearing and external ear normal.     Left Ear: Hearing and external ear normal.     Nose: Nose normal.     Mouth/Throat:     Lips: Pink.     Mouth: Mucous membranes are moist. No injury.     Tongue: No lesions. Tongue does not deviate from midline.     Palate: No mass and lesions.     Pharynx: Oropharynx is clear. Uvula midline. No pharyngeal swelling, oropharyngeal exudate, posterior oropharyngeal erythema or uvula swelling.     Tonsils: No tonsillar exudate or tonsillar abscesses.  Eyes:     General: Lids are normal. Vision grossly intact. Gaze aligned appropriately.     Extraocular Movements: Extraocular movements intact.     Conjunctiva/sclera: Conjunctivae normal.  Cardiovascular:     Rate and Rhythm: Normal rate and regular rhythm.     Heart sounds: Normal heart sounds, S1 normal and S2 normal.  Pulmonary:     Effort: Pulmonary effort is normal. No respiratory distress.     Breath sounds: Normal breath sounds and air entry.  Abdominal:     General: Bowel sounds are normal.     Palpations: Abdomen is soft.     Tenderness: There is no  abdominal tenderness. There is no right CVA tenderness, left CVA tenderness or guarding.  Musculoskeletal:     Cervical back: Neck supple.  Skin:    General: Skin is warm and dry.     Capillary Refill: Capillary refill takes less  than 2 seconds.     Findings: No rash.  Neurological:     General: No focal deficit present.     Mental Status: He is alert and oriented to person, place, and time. Mental status is at baseline.     Cranial Nerves: No dysarthria or facial asymmetry.  Psychiatric:        Mood and Affect: Mood normal.        Speech: Speech normal.        Behavior: Behavior normal.        Thought Content: Thought content normal.        Judgment: Judgment normal.      UC Treatments / Results  Labs (all labs ordered are listed, but only abnormal results are displayed) Labs Reviewed - No data to display  EKG   Radiology No results found.  Procedures Procedures (including critical care time)  Medications Ordered in UC Medications  ondansetron (ZOFRAN-ODT) disintegrating tablet 4 mg (4 mg Oral Given 07/19/22 0902)    Initial Impression / Assessment and Plan / UC Course  I have reviewed the triage vital signs and the nursing notes.  Pertinent labs & imaging results that were available during my care of the patient were reviewed by me and considered in my medical decision making (see chart for details).   1.  Viral gastroenteritis Presentation is consistent with acute viral gastroenteritis that will likely improve with rest, increased fluid intake, and as needed use of antiemetics. Abdominal exam is without peritoneal signs, patient is nontoxic in appearance, and vitals are hemodynamically stable. Push fluids with water and pedialyte for rehydration. Zofran 4mg  ODT given in clinic for nausea and vomiting. May use zofran 4mg  ODT every 8 hours as needed for nausea and vomiting. May use Tylenol for abdominal discomfort at home related to viral illness. Bland/liquid diet  recommended for 12-24 hours, then increase diet to normal as tolerated.   Discussed physical exam and available lab work findings in clinic with patient.  Counseled patient regarding appropriate use of medications and potential side effects for all medications recommended or prescribed today. Discussed red flag signs and symptoms of worsening condition,when to call the PCP office, return to urgent care, and when to seek higher level of care in the emergency department. Patient verbalizes understanding and agreement with plan. All questions answered. Patient discharged in stable condition.    Final Clinical Impressions(s) / UC Diagnoses   Final diagnoses:  Viral gastroenteritis     Discharge Instructions      Your evaluation suggests that your symptoms are most likely due to viral stomach illness (gastroenteritis) which will improve on its own with rest and fluids in the next few days.   I have prescribed an antinausea medication for you to take at home called Zofran. It is the same medication that we gave you in the office.  You may use tylenol 1,000mg  every 6 hours over the counter as needed for abdominal discomfort related to this virus.   Eat a bland diet for the next 12-24 hours (bananas, rice, white toast, and applesauce) once you are able to tolerate liquids (broth, etc). These foods are easy for your stomach to digest. Pedialyte can be purchased to help with rehydration. Drink plenty of water.   Please follow up with your primary care provider for further management. Return if you experience worsening or uncontrolled pain, inability to tolerate fluids by mouth, difficulty breathing, fevers 100.31F or greater, recurrent vomiting, or any other concerning symptoms. I  hope you feel better!      ED Prescriptions     Medication Sig Dispense Auth. Provider   ondansetron (ZOFRAN-ODT) 4 MG disintegrating tablet Take 1 tablet (4 mg total) by mouth every 8 (eight) hours as needed for nausea  or vomiting. 20 tablet Carlisle Beers, FNP      PDMP not reviewed this encounter.   Carlisle Beers, [place] 07/19/22 1023

## 2022-11-30 ENCOUNTER — Other Ambulatory Visit: Payer: Self-pay

## 2022-11-30 ENCOUNTER — Ambulatory Visit (HOSPITAL_COMMUNITY)
Admission: EM | Admit: 2022-11-30 | Discharge: 2022-11-30 | Disposition: A | Discharge status: Home / Self Care | Payer: 59 | Attending: Emergency Medicine | Admitting: Emergency Medicine

## 2022-11-30 ENCOUNTER — Encounter (HOSPITAL_COMMUNITY): Payer: Self-pay | Admitting: Emergency Medicine

## 2022-11-30 DIAGNOSIS — R002 Palpitations: Secondary | ICD-10-CM | POA: Insufficient documentation

## 2022-11-30 LAB — CBC
HCT: 47.6 % (ref 39.0–52.0)
Hemoglobin: 15.9 g/dL (ref 13.0–17.0)
MCH: 32.3 pg (ref 26.0–34.0)
MCHC: 33.4 g/dL (ref 30.0–36.0)
MCV: 96.6 fL (ref 80.0–100.0)
Platelets: 254 10*3/uL (ref 150–400)
RBC: 4.93 MIL/uL (ref 4.22–5.81)
RDW: 12.9 % (ref 11.5–15.5)
WBC: 8.8 10*3/uL (ref 4.0–10.5)
nRBC: 0 % (ref 0.0–0.2)

## 2022-11-30 LAB — POCT FASTING CBG KUC MANUAL ENTRY: POCT Glucose (KUC): 112 mg/dL — AB (ref 70–99)

## 2022-11-30 NOTE — Discharge Instructions (Addendum)
Today you were evaluated for palpitations  EKG at this time is stable showing heart is beating in a regular pace and rhythm  Normal lung and heart sounds are heard when you are listening to on exam  Blood sugar in office is 112 which is not elevated sugar  Blood work checking your electrolytes, kidney liver and blood levels are pending, you will be notified of any concerning value  You have been given information to cardiology which is the heart specialist, please schedule follow-up appointment for reevaluation of your symptoms  At any point if your symptoms continue to occur and become more frequent or worsen in severity please go to the nearest emergency department for immediate evaluation and workup

## 2022-11-30 NOTE — ED Provider Notes (Signed)
[facility]    CSN: [id number] Arrival date & time: 11/30/22  1329      History   Chief Complaint Chief Complaint  Patient presents with   Palpitations    HPI Troy Barnett is a 47 y.o. male.   Patient presents for evaluation of palpitations beginning 1 day ago and lightheadedness beginning today.  Started abruptly without precipitating event, occurring intermittently, described as skipping beats and racing.  Symptoms today lasted approximately 1 hour before spontaneous resolution.  Began to feel lightheaded and like "something was off "attempted to eat his lunch but this did not improve symptoms.  Went to be evaluated by nurse at work who noted blood pressure and heart rate to be elevated, checked 4 times peaking at 152/102 with a heart rate of 101.  Was sent home from work and was told to be evaluated.  Denies cardiac history.  History of familial hypertension.  Daily tobacco use.  Denies chest pain or shortness of breath, visual disturbance, weakness, headache, dizziness.     History reviewed. No pertinent past medical history.  Patient Active Problem List   Diagnosis Date Noted   Encounter for screening for COVID-19 04/07/2020    History reviewed. No pertinent surgical history.     Home Medications    Prior to Admission medications   Medication Sig Start Date End Date Taking? Authorizing Provider  acetaminophen (TYLENOL) 500 MG tablet Take 2 tablets (1,000 mg total) by mouth every 6 (six) hours as needed. 11/18/21   Carlisle Beers, FNP  albuterol (VENTOLIN HFA) 108 (90 Base) MCG/ACT inhaler Inhale 1-2 puffs into the lungs every 6 (six) hours as needed for wheezing or shortness of breath. Patient not taking: Reported on 11/30/2022 06/10/21   Raspet, Noberto Retort, PA-C  ibuprofen (ADVIL) 600 MG tablet Take 1 tablet (600 mg total) by mouth every 6 (six) hours as needed. 11/18/21   Carlisle Beers, FNP  LORazepam (ATIVAN) 1 MG tablet Take 1 tablet (1 mg total) by  mouth every 8 (eight) hours as needed for anxiety. Patient not taking: Reported on 04/07/2020 07/08/19   Zadie Rhine, MD  ondansetron (ZOFRAN-ODT) 4 MG disintegrating tablet Take 1 tablet (4 mg total) by mouth every 8 (eight) hours as needed for nausea or vomiting. Patient not taking: Reported on 11/30/2022 07/19/22   Carlisle Beers, FNP  promethazine-dextromethorphan (PROMETHAZINE-DM) 6.25-15 MG/5ML syrup Take 5 mLs by mouth 2 (two) times daily as needed for cough. Patient not taking: Reported on 11/30/2022 06/10/21   Raspet, Denny Peon K, PA-C  sodium chloride (OCEAN) 0.65 % SOLN nasal spray Place 2 sprays into both nostrils as needed for up to 15 days for congestion. 03/20/19 07/07/19  Darr, Gerilyn Pilgrim, PA-C    Family History History reviewed. No pertinent family history.  Social History Social History   Tobacco Use   Smoking status: Every Day    Current packs/day: 0.00    Types: Cigarettes    Last attempt to quit: 04/07/2020    Years since quitting: 2.6   Smokeless tobacco: Never  Vaping Use   Vaping status: Never Used  Substance Use Topics   Alcohol use: Yes    Alcohol/week: 1.0 standard drink of alcohol    Types: 1 Cans of beer per week   Drug use: No     Allergies   Patient has no known allergies.   Review of Systems Review of Systems  Constitutional: Negative.   Eyes: Negative.   Respiratory: Negative.  Cardiovascular:  Positive for palpitations. Negative for chest pain and leg swelling.  Gastrointestinal: Negative.   Neurological: Negative.      Physical Exam Triage Vital Signs ED Triage Vitals  Encounter Vitals Group     BP 11/30/22 1451 138/88     Systolic BP Percentile --      Diastolic BP Percentile --      Pulse Rate 11/30/22 1451 86     Resp 11/30/22 1451 18     Temp 11/30/22 1451 98.6 F (37 C)     Temp Source 11/30/22 1451 Oral     SpO2 11/30/22 1451 97 %     Weight --      Height --      Head Circumference --      Peak Flow --      Pain Score  11/30/22 1449 0     Pain Loc --      Pain Education --      Exclude from Growth Chart --    No data found.  Updated Vital Signs BP 138/88 (BP Location: Right Arm)   Pulse 86   Temp 98.6 F (37 C) (Oral)   Resp 18   SpO2 97%   Visual Acuity Right Eye Distance:   Left Eye Distance:   Bilateral Distance:    Right Eye Near:   Left Eye Near:    Bilateral Near:     Physical Exam Constitutional:      Appearance: Normal appearance.  Eyes:     Extraocular Movements: Extraocular movements intact.  Cardiovascular:     Rate and Rhythm: Normal rate and regular rhythm.     Pulses: Normal pulses.     Heart sounds: Normal heart sounds.  Pulmonary:     Effort: Pulmonary effort is normal.     Breath sounds: Normal breath sounds.  Neurological:     General: No focal deficit present.     Mental Status: He is alert and oriented to person, place, and time. Mental status is at baseline.     Cranial Nerves: No cranial nerve deficit.     Sensory: No sensory deficit.     Motor: No weakness.     Coordination: Coordination normal.     Gait: Gait normal.      UC Treatments / Results  Labs (all labs ordered are listed, but only abnormal results are displayed) Labs Reviewed - No data to display  EKG   Radiology No results found.  Procedures Procedures (including critical care time)  Medications Ordered in UC Medications - No data to display  Initial Impression / Assessment and Plan / UC Course  I have reviewed the triage vital signs and the nursing notes.  Pertinent labs & imaging results that were available during my care of the patient were reviewed by me and considered in my medical decision making (see chart for details).  Palpitations  Vital signs are stable, patient is in no signs of distress nor toxic appearing, sitting calmly in exam room on telephone, S1 and S2 heard auscultation is very low as clear lung sounds, EKG shows normal sinus rhythm, evaluated by 2  providers in clinic, point-of-care CBG 112, stable for outpatient management, BMP and CBC pending, PCP referral placed as well as cardiology referral given for follow-up, advised to monitor and that if symptoms persist or worsen he is to go to the nearest emergency department for full cardiac workup and further evaluation, verbalized understanding, work note given Final Clinical Impressions(s) /  UC Diagnoses   Final diagnoses:  Palpitations     Discharge Instructions      Today you were evaluated for palpitations  EKG at this time is stable showing heart is beating in a regular pace and rhythm  You have been given information to cardiology which is the heart specialist, please schedule follow-up appointment for reevaluation of your symptoms  At any point if your symptoms continue to occur and become more frequent or worsen in severity please go to the nearest emergency department for immediate evaluation and workup   ED Prescriptions   None    PDMP not reviewed this encounter.   Valinda Hoar, NP 11/30/22 [phone]

## 2022-11-30 NOTE — ED Triage Notes (Signed)
Patient had an episode of heart racing and then felt light headed while at work.  Patient tried to eat, but continued to feel odd.  Nurse at work checked blood pressure -143/91, 101, 150/95, 98  Has not had any medications since incident at work.  Nurse at work instructed patient to see his provider.  Patient does not have a pcp

## 2022-12-01 ENCOUNTER — Telehealth (HOSPITAL_COMMUNITY): Payer: Self-pay

## 2022-12-01 ENCOUNTER — Telehealth: Payer: Self-pay | Admitting: Emergency Medicine

## 2022-12-01 ENCOUNTER — Ambulatory Visit (HOSPITAL_COMMUNITY)
Admission: EM | Admit: 2022-12-01 | Discharge: 2022-12-01 | Disposition: A | Discharge status: Home / Self Care | Payer: 59 | Attending: Internal Medicine | Admitting: Internal Medicine

## 2022-12-01 DIAGNOSIS — R42 Dizziness and giddiness: Secondary | ICD-10-CM | POA: Insufficient documentation

## 2022-12-01 DIAGNOSIS — R002 Palpitations: Secondary | ICD-10-CM | POA: Insufficient documentation

## 2022-12-01 LAB — BASIC METABOLIC PANEL
Anion gap: 8 (ref 5–15)
BUN: 10 mg/dL (ref 6–20)
CO2: 25 mmol/L (ref 22–32)
Calcium: 9.1 mg/dL (ref 8.9–10.3)
Chloride: 103 mmol/L (ref 98–111)
Creatinine, Ser: 1.18 mg/dL (ref 0.61–1.24)
GFR, Estimated: >60 mL/min (ref >60)
Glucose, Bld: 86 mg/dL (ref 70–99)
Potassium: 4.3 mmol/L (ref 3.5–5.1)
Sodium: 136 mmol/L (ref 135–145)

## 2022-12-01 NOTE — Telephone Encounter (Signed)
Notified of hemolysized lab work, returning to clinic for redraw

## 2022-12-01 NOTE — ED Notes (Addendum)
Pt here for no charge redraw

## 2022-12-27 ENCOUNTER — Encounter (HOSPITAL_COMMUNITY): Payer: Self-pay

## 2023-01-28 DIAGNOSIS — R519 Headache, unspecified: Secondary | ICD-10-CM | POA: Diagnosis not present

## 2023-01-28 DIAGNOSIS — Z556 Problems related to health literacy: Secondary | ICD-10-CM | POA: Diagnosis not present

## 2023-01-28 DIAGNOSIS — Z20822 Contact with and (suspected) exposure to covid-19: Secondary | ICD-10-CM | POA: Diagnosis not present

## 2023-03-13 ENCOUNTER — Encounter (HOSPITAL_COMMUNITY): Payer: Self-pay | Admitting: Emergency Medicine

## 2023-03-13 ENCOUNTER — Ambulatory Visit (HOSPITAL_COMMUNITY)
Admission: EM | Admit: 2023-03-13 | Discharge: 2023-03-13 | Disposition: A | Discharge status: Home / Self Care | Payer: 59 | Attending: Family Medicine | Admitting: Family Medicine

## 2023-03-13 DIAGNOSIS — R030 Elevated blood-pressure reading, without diagnosis of hypertension: Secondary | ICD-10-CM

## 2023-03-13 LAB — POCT FASTING CBG KUC MANUAL ENTRY: POCT Glucose (KUC): 82 mg/dL (ref 70–99)

## 2023-03-13 MED ORDER — BLOOD PRESSURE KIT: PACK | 0 refills | Status: AC

## 2023-03-13 NOTE — Discharge Instructions (Signed)
Your blood pressure was noted to be elevated during your visit today. If you are currently taking medication for high blood pressure, please ensure you are taking this as directed. If you do not have a history of high blood pressure and your blood pressure remains persistently elevated, you may need to begin taking a medication at some point. You may return here within the next few days to recheck if unable to see your primary care provider or if you do not have a one.  BP (!) 168/105 (BP Location: Left Arm)   Pulse 71   Temp 98 F (36.7 C) (Oral)   Resp 16   SpO2 95%   BP Readings from Last 3 Encounters:  03/13/23 (!) 168/105  11/30/22 138/88  07/19/22 (!) 124/90

## 2023-03-13 NOTE — ED Triage Notes (Signed)
Pt presents after having episode of high BP while at work. Documented 126/94, 148/102, and 164/106.

## 2023-03-15 NOTE — ED Provider Notes (Signed)
[facility]   [id number] 03/13/23 Arrival Time: 1107  ASSESSMENT & PLAN:  1. Elevated blood pressure reading without diagnosis of hypertension    Will check BP and get trends. Encouraged to f/u with PCP.  Meds ordered this encounter  Medications   Blood Pressure KIT    Sig: Use as needed to take blood pressure. May substitute to any manufacturer covered by patient's insurance.    Dispense:  1 kit    Refill:  0     Follow-up Information     Schedule an appointment as soon as possible for a visit  with Storm Frisk, MD.   Specialty: Pulmonary Disease Contact information: [address] [place] [address] [phone]                 Reviewed expectations re: course of current medical issues. Questions answered. Outlined signs and symptoms indicating need for more acute intervention. Patient verbalized understanding. After Visit Summary given.   SUBJECTIVE:  Troy Barnett is a 47 y.o. male who presents with concerns regarding increased blood pressures. Has not been treated for BP in past. Was at work; felt a little lightheaded. BP was "high" when they checked it. Systolic in 160s. Denies HA/visual changes/n/v. No LE edema. Feels well. Denies CP/SOB.   Social History   Tobacco Use  Smoking Status Every Day   Current packs/day: 0.00   Types: Cigarettes   Last attempt to quit: 04/07/2020   Years since quitting: 2.9  Smokeless Tobacco Never      OBJECTIVE:  Vitals:   03/13/23 1258  BP: (!) 168/105  Pulse: 71  Resp: 16  Temp: 98 F (36.7 C)  TempSrc: Oral  SpO2: 95%    General appearance: alert; no distress Eyes: PERRLA; EOMI HENT: normocephalic; atraumatic Neck: supple Lungs: clear to auscultation bilaterally Heart: regular rate and rhythm without murmer Abdomen: soft, non-tender; bowel sounds normal Extremities: no edema; symmetrical with no gross deformities Skin: warm and dry Psychological: alert and  cooperative; normal mood and affect  ECG: Orders placed or performed during the hospital encounter of 11/30/22   ED EKG   ED EKG    Labs: Results for orders placed or performed during the hospital encounter of 03/13/23  POC CBG monitoring   Collection Time: 03/13/23  1:59 PM  Result Value Ref Range   POCT Glucose ([facility]) 82 70 - 99 mg/dL   Labs Reviewed  POCT FASTING CBG [facility] MANUAL ENTRY - Normal    Imaging: No results found.  No Known Allergies  History reviewed. No pertinent past medical history. Social History   Socioeconomic History   Marital status: Significant Other    Spouse name: Not on file   Number of children: Not on file   Years of education: Not on file   Highest education level: Not on file  Occupational History   Not on file  Tobacco Use   Smoking status: Every Day    Current packs/day: 0.00    Types: Cigarettes    Last attempt to quit: 04/07/2020    Years since quitting: 2.9   Smokeless tobacco: Never  Vaping Use   Vaping status: Never Used  Substance and Sexual Activity   Alcohol use: Yes    Alcohol/week: 1.0 standard drink of alcohol    Types: 1 Cans of beer per week   Drug use: No   Sexual activity: Yes  Other Topics Concern   Not on file  Social History Narrative  Not on file   Social Drivers of Health   Financial Resource Strain: Not on file  Food Insecurity: Not on file  Transportation Needs: Not on file  Physical Activity: Not on file  Stress: Not on file  Social Connections: Not on file  Intimate Partner Violence: Not on file   History reviewed. No pertinent family history. History reviewed. No pertinent surgical history.     Mardella Layman, MD 03/15/23 1125

## 2023-04-28 ENCOUNTER — Ambulatory Visit (HOSPITAL_COMMUNITY)
Admission: EM | Admit: 2023-04-28 | Discharge: 2023-04-28 | Disposition: A | Discharge status: Home / Self Care | Payer: 59 | Attending: Physician Assistant | Admitting: Physician Assistant

## 2023-04-28 ENCOUNTER — Encounter (HOSPITAL_COMMUNITY): Payer: Self-pay

## 2023-04-28 DIAGNOSIS — J069 Acute upper respiratory infection, unspecified: Secondary | ICD-10-CM

## 2023-04-28 LAB — POC COVID19/FLU A&B COMBO
Covid Antigen, POC: NEGATIVE
Influenza A Antigen, POC: NEGATIVE
Influenza B Antigen, POC: NEGATIVE

## 2023-04-28 NOTE — Discharge Instructions (Signed)
Based on your described symptoms and the duration of symptoms it is likely that you have a viral upper respiratory infection (often called a "cold")  Symptoms can last for 3-10 days with lingering cough and intermittent symptoms lasting weeks after that.  The goal of treatment at this time is to reduce your symptoms and discomfort   You can use over the counter medications such as Dayquil/Nyquil, AlkaSeltzer formulations, etc to provide further relief of symptoms according to the manufacturer's instructions    If your symptoms do not improve or become worse in the next 5-7 days please make an apt at the office so we can see you  Go to the ER if you begin to have more serious symptoms such as shortness of breath, trouble breathing, loss of consciousness, swelling around the eyes, high fever, severe lasting headaches, vision changes or neck pain/stiffness.

## 2023-04-28 NOTE — ED Provider Notes (Signed)
[facility]    CSN: [id number] Arrival date & time: 04/28/23  0803      History   Chief Complaint Chief Complaint  Patient presents with   Headache   Cough    HPI Troy Barnett is a 48 y.o. male.   HPI  He reports having nasal congestion, headaches, fever, chills, fatigue, dry cough with associated chest pains, sinus pressure He reports symptoms started last night with body aches and fevers   He reports his wife was diagnosed with flu prior to symptoms starting  Interventions: Sudafed    History reviewed. No pertinent past medical history.  Patient Active Problem List   Diagnosis Date Noted   Encounter for screening for COVID-19 04/07/2020    History reviewed. No pertinent surgical history.     Home Medications    Prior to Admission medications   Medication Sig Start Date End Date Taking? Authorizing Provider  acetaminophen (TYLENOL) 500 MG tablet Take 2 tablets (1,000 mg total) by mouth every 6 (six) hours as needed. 11/18/21   Carlisle Beers, FNP  albuterol (VENTOLIN HFA) 108 (90 Base) MCG/ACT inhaler Inhale 1-2 puffs into the lungs every 6 (six) hours as needed for wheezing or shortness of breath. Patient not taking: Reported on 11/30/2022 06/10/21   Raspet, Noberto Retort, PA-C  Blood Pressure KIT Use as needed to take blood pressure. May substitute to any manufacturer covered by patient's insurance. 03/13/23   Mardella Layman, MD  ibuprofen (ADVIL) 600 MG tablet Take 1 tablet (600 mg total) by mouth every 6 (six) hours as needed. 11/18/21   Carlisle Beers, FNP  LORazepam (ATIVAN) 1 MG tablet Take 1 tablet (1 mg total) by mouth every 8 (eight) hours as needed for anxiety. Patient not taking: Reported on 04/07/2020 07/08/19   Zadie Rhine, MD  ondansetron (ZOFRAN-ODT) 4 MG disintegrating tablet Take 1 tablet (4 mg total) by mouth every 8 (eight) hours as needed for nausea or vomiting. Patient not taking: Reported on 11/30/2022 07/19/22   Carlisle Beers, FNP  promethazine-dextromethorphan (PROMETHAZINE-DM) 6.25-15 MG/5ML syrup Take 5 mLs by mouth 2 (two) times daily as needed for cough. Patient not taking: Reported on 11/30/2022 06/10/21   Raspet, Denny Peon K, PA-C  sodium chloride (OCEAN) 0.65 % SOLN nasal spray Place 2 sprays into both nostrils as needed for up to 15 days for congestion. 03/20/19 07/07/19  Darr, Gerilyn Pilgrim, PA-C    Family History History reviewed. No pertinent family history.  Social History Social History   Tobacco Use   Smoking status: Every Day    Current packs/day: 0.00    Types: Cigarettes    Last attempt to quit: 04/07/2020    Years since quitting: 3.0   Smokeless tobacco: Never  Vaping Use   Vaping status: Never Used  Substance Use Topics   Alcohol use: Yes    Alcohol/week: 1.0 standard drink of alcohol    Types: 1 Cans of beer per week   Drug use: No     Allergies   Patient has no known allergies.   Review of Systems Review of Systems  Constitutional:  Positive for appetite change, chills, fatigue and fever.  HENT:  Positive for congestion, sinus pressure and sinus pain. Negative for ear pain, postnasal drip and sore throat.   Respiratory:  Positive for cough. Negative for shortness of breath and wheezing.   Gastrointestinal:  Negative for diarrhea, nausea and vomiting.  Musculoskeletal:  Positive for myalgias.  Neurological:  Positive for headaches.  Physical Exam Triage Vital Signs ED Triage Vitals  Encounter Vitals Group     BP 04/28/23 0824 (!) 156/97     Systolic BP Percentile --      Diastolic BP Percentile --      Pulse Rate 04/28/23 0824 (!) 101     Resp 04/28/23 0824 18     Temp 04/28/23 0824 (!) 101.6 F (38.7 C)     Temp Source 04/28/23 0824 Oral     SpO2 04/28/23 0824 95 %     Weight --      Height --      Head Circumference --      Peak Flow --      Pain Score 04/28/23 0822 10     Pain Loc --      Pain Education --      Exclude from Growth Chart --    No data  found.  Updated Vital Signs BP (!) 156/97 (BP Location: Left Arm)   Pulse (!) 101   Temp (!) 101.6 F (38.7 C) (Oral)   Resp 18   SpO2 95%   Visual Acuity Right Eye Distance:   Left Eye Distance:   Bilateral Distance:    Right Eye Near:   Left Eye Near:    Bilateral Near:     Physical Exam Vitals reviewed.  Constitutional:      General: He is awake.     Appearance: He is well-developed and well-groomed. He is ill-appearing.  HENT:     Head: Normocephalic and atraumatic.     Right Ear: Hearing, tympanic membrane and ear canal normal.     Left Ear: Hearing, tympanic membrane and ear canal normal.     Mouth/Throat:     Lips: Pink.     Mouth: Mucous membranes are moist.     Pharynx: Oropharynx is clear. Uvula midline. No pharyngeal swelling, oropharyngeal exudate, posterior oropharyngeal erythema or postnasal drip.  Cardiovascular:     Rate and Rhythm: Regular rhythm. Tachycardia present.     Heart sounds: Normal heart sounds. No murmur heard.    No friction rub. No gallop.  Pulmonary:     Effort: Pulmonary effort is normal.     Breath sounds: Normal breath sounds. No decreased air movement. No decreased breath sounds, wheezing, rhonchi or rales.  Musculoskeletal:     Cervical back: Normal range of motion and neck supple.  Lymphadenopathy:     Head:     Right side of head: No submental, submandibular or preauricular adenopathy.     Left side of head: No submental, submandibular or preauricular adenopathy.     Cervical:     Right cervical: No superficial cervical adenopathy.    Left cervical: No superficial cervical adenopathy.     Upper Body:     Right upper body: No supraclavicular adenopathy.     Left upper body: No supraclavicular adenopathy.  Neurological:     Mental Status: He is alert.  Psychiatric:        Behavior: Behavior is cooperative.      UC Treatments / Results  Labs (all labs ordered are listed, but only abnormal results are displayed) Labs  Reviewed  POC COVID19/FLU A&B COMBO - Normal    EKG   Radiology No results found.  Procedures Procedures (including critical care time)  Medications Ordered in UC Medications - No data to display  Initial Impression / Assessment and Plan / UC Course  I have reviewed the triage vital signs and  the nursing notes.  Pertinent labs & imaging results that were available during my care of the patient were reviewed by me and considered in my medical decision making (see chart for details).      Final Clinical Impressions(s) / UC Diagnoses   Final diagnoses:  Viral upper respiratory tract infection    Visit with patient indicates symptoms comprised of headaches, body aches, dry cough, fever, chills  since last night  congruent with acute URI that is likely viral in nature  Patient has tested negative for COVID and flu today in clinic, reviewed results with him during apt Due to nature and duration of symptoms recommended treatment regimen is symptomatic relief and follow up if needed Discussed with patient the various viral and bacterial etiologies of current illness and appropriate course of treatment Discussed OTC medication options for multisymptom relief such as Dayquil/Nyquil, Theraflu, AlkaSeltzer, etc. Discussed return precautions if symptoms are not improving or worsen over next 5-7 days.     Discharge Instructions      Based on your described symptoms and the duration of symptoms it is likely that you have a viral upper respiratory infection (often called a "cold")  Symptoms can last for 3-10 days with lingering cough and intermittent symptoms lasting weeks after that.  The goal of treatment at this time is to reduce your symptoms and discomfort   You can use over the counter medications such as Dayquil/Nyquil, AlkaSeltzer formulations, etc to provide further relief of symptoms according to the manufacturer's instructions     If your symptoms do not improve or  become worse in the next 5-7 days please make an apt at the office so we can see you  Go to the ER if you begin to have more serious symptoms such as shortness of breath, trouble breathing, loss of consciousness, swelling around the eyes, high fever, severe lasting headaches, vision changes or neck pain/stiffness.       ED Prescriptions   None    PDMP not reviewed this encounter.   Providence Crosby, PA-C 04/28/23 1610

## 2023-04-28 NOTE — ED Triage Notes (Signed)
Pt c/o of body aches, cough, head congestion, chest congestion, nasal congestion, fatigue, loss of appetite, headache, and his chest hurts when he coughs. His wife was diagnosed with the Flu on Thursday.   Start Date: 04/25/2022  Home Interventions: Liquids, Pain Medication last night

## 2023-06-23 ENCOUNTER — Other Ambulatory Visit: Payer: Self-pay

## 2023-06-23 ENCOUNTER — Emergency Department (HOSPITAL_BASED_OUTPATIENT_CLINIC_OR_DEPARTMENT_OTHER)
Admission: EM | Admit: 2023-06-23 | Discharge: 2023-06-23 | Discharge status: Against Medical Advice | Payer: Self-pay | Attending: Emergency Medicine | Admitting: Emergency Medicine

## 2023-06-23 DIAGNOSIS — R519 Headache, unspecified: Secondary | ICD-10-CM | POA: Insufficient documentation

## 2023-06-23 DIAGNOSIS — R42 Dizziness and giddiness: Secondary | ICD-10-CM | POA: Insufficient documentation

## 2023-06-23 DIAGNOSIS — I10 Essential (primary) hypertension: Secondary | ICD-10-CM | POA: Insufficient documentation

## 2023-06-23 DIAGNOSIS — Z5321 Procedure and treatment not carried out due to patient leaving prior to being seen by health care provider: Secondary | ICD-10-CM | POA: Insufficient documentation

## 2023-06-23 NOTE — ED Triage Notes (Signed)
 Pt here bc he has had elevated BP due to stress and had a headache. Today pt woke up with headache all day and began feeling dizzy at work. BP at work was 144/110 so he was sent here. Pt does not take bp meds and has no hx of HTN. Denies vision changes or chest pain. Does feel fatigue.

## 2023-06-23 NOTE — ED Notes (Signed)
 Patient left AMA. EDP informed.

## 2024-01-09 ENCOUNTER — Other Ambulatory Visit: Payer: Self-pay

## 2024-01-09 ENCOUNTER — Emergency Department (HOSPITAL_COMMUNITY)
Admission: EM | Admit: 2024-01-09 | Discharge: 2024-01-09 | Disposition: A | Discharge status: Home / Self Care | Payer: Self-pay | Attending: Emergency Medicine | Admitting: Emergency Medicine

## 2024-01-09 ENCOUNTER — Encounter (HOSPITAL_COMMUNITY): Payer: Self-pay

## 2024-01-09 DIAGNOSIS — I10 Essential (primary) hypertension: Secondary | ICD-10-CM | POA: Insufficient documentation

## 2024-01-09 DIAGNOSIS — D72829 Elevated white blood cell count, unspecified: Secondary | ICD-10-CM | POA: Insufficient documentation

## 2024-01-09 LAB — BASIC METABOLIC PANEL WITH GFR
Anion gap: 11 (ref 5–15)
BUN: 14 mg/dL (ref 6–20)
CO2: 22 mmol/L (ref 22–32)
Calcium: 9 mg/dL (ref 8.9–10.3)
Chloride: 102 mmol/L (ref 98–111)
Creatinine, Ser: 1.27 mg/dL — ABNORMAL HIGH (ref 0.61–1.24)
GFR, Estimated: >60 mL/min (ref >60)
Glucose, Bld: 81 mg/dL (ref 70–99)
Potassium: 4.8 mmol/L (ref 3.5–5.1)
Sodium: 135 mmol/L (ref 135–145)

## 2024-01-09 LAB — CBC
HCT: 46 % (ref 39.0–52.0)
Hemoglobin: 15.5 g/dL (ref 13.0–17.0)
MCH: 33 pg (ref 26.0–34.0)
MCHC: 33.7 g/dL (ref 30.0–36.0)
MCV: 97.9 fL (ref 80.0–100.0)
Platelets: 256 K/uL (ref 150–400)
RBC: 4.7 MIL/uL (ref 4.22–5.81)
RDW: 12.4 % (ref 11.5–15.5)
WBC: 12.1 K/uL — ABNORMAL HIGH (ref 4.0–10.5)
nRBC: 0 % (ref 0.0–0.2)

## 2024-01-09 MED ORDER — AMLODIPINE BESYLATE 5 MG PO TABS
5.0000 mg | ORAL_TABLET | Freq: Once | ORAL | Status: AC
  Administered 2024-01-09: 5 mg via ORAL
  Filled 2024-01-09: qty 1

## 2024-01-09 MED ORDER — AMLODIPINE BESYLATE 5 MG PO TABS: 5.0000 mg | ORAL_TABLET | Freq: Every day | ORAL | 2 refills | Status: DC

## 2024-01-09 NOTE — Discharge Instructions (Addendum)
 You had elevated blood pressure, which likely made you feel your symptoms.  Please take your amlodipine daily to help control your blood pressure.  You need to find a primary care doctor as soon as possible to work on long term management of your blood pressure.  Please set up an appointment as soon as you can.  Please also work on quitting smoking, as this raises your blood pressure and causes many other health effects.  Your PCP could help with that as well.

## 2024-01-09 NOTE — ED Triage Notes (Addendum)
 Pt reports that he woke up with a crink in my neck and once he got to work (around 11pm) he started feeling like his BP shot up. BP 191/110 at work

## 2024-01-09 NOTE — ED Provider Notes (Cosign Needed Addendum)
 [place] EMERGENCY DEPARTMENT AT [facility] Provider Note   CSN: [id number] Arrival date & time: 01/09/24  0038     Patient presents with: Hypertension   Troy Barnett is a 48 y.o. male.   Troy Barnett is a 48 year old male with past medical history of hypertension.  Patient presents to the ED because blood pressure cuff machine at work showed blood pressure 197/102.  He reports that he was having spots in his vision at times his blood pressure was obtained which is how he knew that it would be elevated.  He commonly gets the symptoms with high blood pressure.  To the best of his knowledge, he has had high blood pressure for the last few years.  Patient states he is not currently taking any medicines for blood pressure or otherwise.  Patient notes that he has a mild headache and dry throat at this point, but did not have any headache at the time of the blood pressure reading at work.  Also notes that he has a crick in his neck on the right side.  Denies recent swelling in legs, fevers, shortness of breath, and chest pain.  Is not aware of any personal history of kidney disease.  Patient does have a history of tobacco use but did not smoke prior to blood pressure being taken at work.  Also states he did not drink coffee prior.  Denies other substance use including cocaine.  Of note, patient was seen at [facility] on 3/31 for hypertension with similar symptoms and prescribed amlodipine 5 mg.  Patient states that he only got a short supply and ran out without any refills.  Patient has no PCP.  States that he is getting things together by the 20th of this month to schedule a PCP appointment, has no appointment currently scheduled.  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   Hypertension Pertinent negatives include no chest pain, no abdominal pain and no shortness of breath.      Prior to  Admission medications   Medication Sig Start Date End Date Taking? Authorizing Provider  acetaminophen  (TYLENOL ) 500 MG tablet Take 2 tablets (1,000 mg total) by mouth every 6 (six) hours as needed. 11/18/21   Enedelia Dorna HERO, FNP  albuterol  (VENTOLIN  HFA) 108 (90 Base) MCG/ACT inhaler Inhale 1-2 puffs into the lungs every 6 (six) hours as needed for wheezing or shortness of breath. Patient not taking: Reported on 11/30/2022 06/10/21   Raspet, Rocky POUR, PA-C  Blood Pressure KIT Use as needed to take blood pressure. May substitute to any manufacturer covered by patient's insurance. 03/13/23   Rolinda Rogue, MD  ibuprofen  (ADVIL ) 600 MG tablet Take 1 tablet (600 mg total) by mouth every 6 (six) hours as needed. 11/18/21   Enedelia Dorna HERO, FNP  LORazepam  (ATIVAN ) 1 MG tablet Take 1 tablet (1 mg total) by mouth every 8 (eight) hours as needed for anxiety. Patient not taking: Reported on 04/07/2020 07/08/19   Midge Golas, MD  ondansetron  (ZOFRAN -ODT) 4 MG disintegrating tablet Take 1 tablet (4 mg total) by mouth every 8 (eight) hours as needed for nausea or vomiting. Patient not taking: Reported on 11/30/2022 07/19/22   Enedelia Dorna HERO, FNP  promethazine -dextromethorphan (PROMETHAZINE -DM) 6.25-15 MG/5ML syrup Take 5 mLs by mouth 2 (two) times daily as needed for cough. Patient not taking: Reported on 11/30/2022 06/10/21   Raspet, Rocky  K, PA-C  sodium chloride  (OCEAN) 0.65 % SOLN nasal spray Place 2 sprays into both nostrils as needed for up to 15 days for congestion. 03/20/19 07/07/19  Darr, Jacob, PA-C    Allergies: Patient has no known allergies.    Review of Systems  Constitutional:  Negative for chills and fever.  HENT:  Negative for ear pain and sore throat.   Eyes:  Negative for pain and visual disturbance.  Respiratory:  Negative for cough and shortness of breath.   Cardiovascular:  Negative for chest pain and palpitations.  Gastrointestinal:  Negative for abdominal pain and  vomiting.  Genitourinary:  Negative for dysuria and hematuria.  Musculoskeletal:  Negative for arthralgias and back pain.  Skin:  Negative for color change and rash.  Neurological:  Negative for seizures and syncope.  All other systems reviewed and are negative.   Updated Vital Signs BP (!) 161/99 (BP Location: Right Arm)   Pulse 74   Temp 98.2 F (36.8 C) (Oral)   Resp (!) 22   Ht 5' 8 (1.727 m)   Wt 70.3 kg   SpO2 100%   BMI 23.57 kg/m   Physical Exam Vitals and nursing note reviewed.  Constitutional:      General: He is not in acute distress.    Appearance: He is well-developed.  HENT:     Head: Normocephalic and atraumatic.     Mouth/Throat:     Mouth: Mucous membranes are moist.  Eyes:     Extraocular Movements: Extraocular movements intact.     Conjunctiva/sclera: Conjunctivae normal.     Visual Fields: Right eye visual fields normal.     Left eye: DL in the upper nasal quadrant. DL in the upper temporal quadrant. DL in the lower nasal quadrant. DL in the lower temporal quadrant.     Comments: Left pupil with notable clouding which patient states is chronic.  Cardiovascular:     Rate and Rhythm: Normal rate and regular rhythm.     Pulses: Normal pulses.     Heart sounds: Normal heart sounds. No murmur heard.    No gallop.  Pulmonary:     Effort: Pulmonary effort is normal. No respiratory distress.     Breath sounds: Normal breath sounds. No wheezing or rales.  Chest:     Chest wall: No tenderness.  Abdominal:     Palpations: Abdomen is soft.     Tenderness: There is no abdominal tenderness.  Musculoskeletal:        General: No swelling.     Cervical back: Neck supple. Tenderness (mild R sided TTP over sternocleidomastoid) present. No rigidity.     Right lower leg: No edema.     Left lower leg: No edema.  Skin:    General: Skin is warm and dry.     Capillary Refill: Capillary refill takes 2 to 3 seconds.  Neurological:     General: No focal deficit  present.     Mental Status: He is alert and oriented to person, place, and time. Mental status is at baseline.     Cranial Nerves: No cranial nerve deficit.     Sensory: No sensory deficit.     Coordination: Coordination normal. Finger-Nose-Finger Test normal.     Gait: Gait is intact. Gait normal.  Psychiatric:        Mood and Affect: Mood normal.        Behavior: Behavior normal.     (all labs ordered are listed, but only abnormal results  are displayed) Labs Reviewed  BASIC METABOLIC PANEL WITH GFR - Abnormal; Notable for the following components:      Result Value   Creatinine, Ser 1.27 (*)    All other components within normal limits  CBC - Abnormal; Notable for the following components:   WBC 12.1 (*)    All other components within normal limits    EKG: None  Radiology: No results found.   Procedures   Medications Ordered in the ED - No data to display                                  Medical Decision Making Mancil Pfenning is a 48 yo male with PMH of HTN presenting for elevated BP and dark spots in vision.  CBC showed mild leukocytosis with WBC 12.1, BMP showed borderline elevated creatinine to 1.27 (prior baseline 1.1-1.2).  K within normal limits.  Vision without dark spots at time of evaluation, R visual field normal and L visual field consistent with chronic vision loss.  No significant evidence of end organ dysfunction, not meeting criteria for hypertensive emergency.  Minimal neck pain and no concern for vertebral artery dissection given exam.  Neurological exam reassuringly normal.  BP ranged ~150/100 in ED and ordered amlodipine 5 mg with improvement in BP.  Patient stated he was feeling well and wished to discharge.  Discharged home with prescription for amlodipine 30 days a 2 refills with plan to follow up with PCP once established, provided counseling on importance of long term HTN management.  Provided return precautions if worsening headache, chest pain, or  shortness of breath.  Amount and/or Complexity of Data Reviewed External Data Reviewed: notes.    Details: [facility] ED note 3/31 Labs: ordered. Decision-making details documented in ED Course.  Risk Prescription drug management.      Final diagnoses:  None    ED Discharge Orders     None        Sierra Bissonette, MD 01/09/24 1221    Kesia Dalto, MD 01/09/24 1222    Yolande Lamar BROCKS, MD 01/10/24 [phone]

## 2024-01-09 NOTE — ED Notes (Signed)
 Pt complains of right lateral neck pain ; pain is 8 (1-10) that radiates to shoulder ; hypertensive

## 2024-02-15 ENCOUNTER — Other Ambulatory Visit: Payer: Self-pay | Admitting: Family Medicine

## 2024-02-16 ENCOUNTER — Other Ambulatory Visit: Payer: Self-pay | Admitting: Critical Care Medicine

## 2024-02-16 MED ORDER — AMLODIPINE BESYLATE 5 MG PO TABS: 5.0000 mg | ORAL_TABLET | Freq: Every day | ORAL | 0 refills | Status: AC

## 2024-02-16 NOTE — Telephone Encounter (Signed)
 Troy Barnett  pt not seen since 2022.  Pls schedule soon with any provider  I allowed 30 days supply Dr Brien
